# Patient Record
Sex: Male | Born: 1965 | Race: White | Hispanic: No | Marital: Single | Smoking: Never smoker
Health system: Southern US, Community
[De-identification: ages and names within clinical notes are randomized; demographics above are authoritative.]

## PROBLEM LIST (undated history)

## (undated) DIAGNOSIS — I10 Essential (primary) hypertension: Secondary | ICD-10-CM

## (undated) DIAGNOSIS — E785 Hyperlipidemia, unspecified: Secondary | ICD-10-CM

## (undated) HISTORY — DX: Essential (primary) hypertension: I10

## (undated) HISTORY — PX: WISDOM TOOTH EXTRACTION: SHX21

## (undated) HISTORY — DX: Hyperlipidemia, unspecified: E78.5

---

## 2009-03-25 ENCOUNTER — Ambulatory Visit: Payer: Self-pay | Admitting: Internal Medicine

## 2009-03-25 DIAGNOSIS — H53139 Sudden visual loss, unspecified eye: Secondary | ICD-10-CM | POA: Insufficient documentation

## 2009-03-25 DIAGNOSIS — J3089 Other allergic rhinitis: Secondary | ICD-10-CM

## 2009-03-25 DIAGNOSIS — J45998 Other asthma: Secondary | ICD-10-CM | POA: Insufficient documentation

## 2009-03-25 DIAGNOSIS — Z87898 Personal history of other specified conditions: Secondary | ICD-10-CM

## 2009-03-25 DIAGNOSIS — R21 Rash and other nonspecific skin eruption: Secondary | ICD-10-CM | POA: Insufficient documentation

## 2009-03-25 DIAGNOSIS — J302 Other seasonal allergic rhinitis: Secondary | ICD-10-CM | POA: Insufficient documentation

## 2009-04-01 LAB — CONVERTED CEMR LAB
ALT: 69 units/L — ABNORMAL HIGH (ref 0–53)
AST: 64 units/L — ABNORMAL HIGH (ref 0–37)
Alkaline Phosphatase: 44 units/L (ref 39–117)
BUN: 13 mg/dL (ref 6–23)
Basophils Absolute: 0 10*3/uL (ref 0.0–0.1)
Bilirubin, Direct: 0.1 mg/dL (ref 0.0–0.3)
Calcium: 9.2 mg/dL (ref 8.4–10.5)
Eosinophils Relative: 2.4 % (ref 0.0–5.0)
GFR calc non Af Amer: 76.87 mL/min (ref >60)
Glucose, Bld: 110 mg/dL — ABNORMAL HIGH (ref 70–99)
HCT: 49.4 % (ref 39.0–52.0)
HDL: 45.4 mg/dL (ref >39.00)
Lymphocytes Relative: 35.4 % (ref 12.0–46.0)
Lymphs Abs: 1.6 10*3/uL (ref 0.7–4.0)
Monocytes Relative: 11.7 % (ref 3.0–12.0)
Neutrophils Relative %: 50.4 % (ref 43.0–77.0)
Platelets: 187 10*3/uL (ref 150.0–400.0)
Potassium: 3.8 meq/L (ref 3.5–5.1)
RDW: 12.2 % (ref 11.5–14.6)
TSH: 1.85 microintl units/mL (ref 0.35–5.50)
Total Bilirubin: 0.5 mg/dL (ref 0.3–1.2)
Triglycerides: 175 mg/dL — ABNORMAL HIGH (ref 0.0–149.0)
VLDL: 35 mg/dL (ref 0.0–40.0)
WBC: 4.6 10*3/uL (ref 4.5–10.5)

## 2009-12-16 ENCOUNTER — Ambulatory Visit: Payer: Self-pay | Admitting: Family Medicine

## 2010-01-10 ENCOUNTER — Telehealth (INDEPENDENT_AMBULATORY_CARE_PROVIDER_SITE_OTHER): Payer: Self-pay | Admitting: *Deleted

## 2010-01-21 ENCOUNTER — Ambulatory Visit: Admit: 2010-01-21 | Payer: Self-pay | Admitting: Internal Medicine

## 2010-02-11 NOTE — Assessment & Plan Note (Signed)
Summary: NEW PT PHYSICAL AND FASTING LABS////SPH   Vital Signs:  Patient profile:   45 year old male Height:      67.5 inches Weight:      217.8 pounds BMI:     33.73 Temp:     98.2 degrees F oral Pulse rate:   91 / minute Resp:     14 per minute BP sitting:   118 / 80  (left arm) Cuff size:   large  Vitals Entered By: Shonna Chock (March 25, 2009 8:19 AM) CC: New Patient Establish: CPX with fasting labs  Comments REVIEWED MED LIST, PATIENT AGREED DOSE AND INSTRUCTION CORRECT  Refused TDaP Update today   CC:  New Patient Establish: CPX with fasting labs .  History of Present Illness: Corey Soto is here for a physical; he has some some vision issues, specifically 2 episodes of  transient diplopia following  rapid head rotation, lasting 7(actually timed)- ? 15 minutes. OS vision "went dark" for  ? period ,? X2.These episodes have occurred  in past 18 months.  Preventive Screening-Counseling & Management  Alcohol-Tobacco     Smoking Status: never  Caffeine-Diet-Exercise     Does Patient Exercise: yes  Allergies (verified): No Known Drug Allergies  Past History:  Past Medical History: Allergic rhinitis Asthma  Past Surgical History: Wisdom Teeth Extraction  Family History: Father: hyperglycemia Mother: negative Siblings: neg; MGM lung CA;MGF ophth issues  Social History: Occupation:Computer Work Single Never Smoked Alcohol use-no Regular exercise-yes: 1X /week as weights Smoking Status:  never Does Patient Exercise:  yes  Review of Systems General:  Denies chills, fatigue, fever, sleep disorder, sweats, and weight loss. Eyes:  See HPI; Complains of double vision and vision loss-1 eye; denies discharge, eye pain, and red eye. ENT:  Complains of nasal congestion, postnasal drainage, ringing in ears, and sinus pressure; denies difficulty swallowing and hoarseness; Perennial symptoms even when on allergy shots. Tinnitus since childhood. CV:  Complains of shortness  of breath with exertion; denies chest pain or discomfort, difficulty breathing at night, difficulty breathing while lying down, leg cramps with exertion, palpitations, swelling of feet, and swelling of hands; Occa DOE on elliptical. Resp:  Denies cough, excessive snoring, hypersomnolence, morning headaches, sputum productive, and wheezing; Off Advair X 6 months; ? some decreased capacity. Rare use of rescue albuteral MDI. GI:  Denies abdominal pain, bloody stools, constipation, dark tarry stools, diarrhea, and indigestion. GU:  Denies discharge, dysuria, and hematuria. MS:  Denies joint redness, joint swelling, low back pain, mid back pain, muscle aches, muscle weakness, and thoracic pain. Derm:  Complains of dryness and rash; denies changes in nail beds, hair loss, and lesion(s); Facial rash for years, no Rx. Neuro:  Complains of visual disturbances; denies brief paralysis, disturbances in coordination, headaches, inability to speak, numbness, poor balance, sensation of room spinning, tingling, and weakness; No BPV. Psych:  Denies anxiety and depression. Endo:  Denies cold intolerance, excessive hunger, excessive thirst, excessive urination, and heat intolerance. Heme:  Denies abnormal bruising and bleeding. Allergy:  Complains of sneezing; denies hives or rash and itching eyes; Rare sneezing.  Physical Exam  General:  well-nourished; alert,appropriate and cooperative throughout examination Head:  Normocephalic and atraumatic without obvious abnormalities. No apparent alopecia ; moustache & goatee Eyes:  No corneal or conjunctival inflammation noted. EOMI. Perrla. Funduscopic exam benign, without hemorrhages, exudates or papilledema.Field of  Vision grossly normal. Ears:  External ear exam shows no significant lesions or deformities.  Otoscopic examination reveals clear canals, tympanic  membranes are intact bilaterally without bulging, retraction, inflammation or discharge. Hearing is grossly  normal bilaterally. Nose:  External nasal examination shows no deformity or inflammation. Nasal mucosa are dry without lesions or exudates.Polypoid changes L nare Mouth:  Oral mucosa and oropharynx without lesions or exudates.  Teeth in good repair. Neck:  No deformities, masses, or tenderness noted. Lungs:  Normal respiratory effort, chest expands symmetrically. Lungs are clear to auscultation, no crackles or wheezes. Heart:  Normal rate and regular rhythm. S1 and S2 normal without gallop, murmur, click, rub . S4 Abdomen:  Bowel sounds positive,abdomen soft and non-tender without masses, organomegaly or hernias noted. Rectal:  No external abnormalities noted. Normal sphincter tone. No rectal masses or tenderness. Genitalia:  Testes bilaterally descended without nodularity, tenderness or masses. No scrotal masses or lesions. No penis lesions or urethral discharge. Prostate:  Prostate gland firm and smooth, no enlargement, nodularity, tenderness, mass, asymmetry or induration. Msk:  No deformity or scoliosis noted of thoracic or lumbar spine.   Pulses:  R and L carotid,radial,dorsalis pedis and posterior tibial pulses are full and equal bilaterally Extremities:  No clubbing, cyanosis, edema, or deformity noted with normal full range of motion of all joints.  Mild crepitus of knees  Neurologic:  alert & oriented X3, cranial nerves II-XII intact, strength normal in all extremities, sensation intact to light touch, gait normal, DTRs symmetrical and normal, finger-to-nose normal, heel-to-shin normal, and Romberg negative.   No pronator drift. RAM & repetitive speech normal. Skin:  Facial rosacea suggested Cervical Nodes:  No lymphadenopathy noted Axillary Nodes:  No palpable lymphadenopathy Inguinal Nodes:  No significant adenopathy Psych:  memory intact for recent and remote, normally interactive, and good eye contact.     Impression & Recommendations:  Problem # 1:  ROUTINE GENERAL MEDICAL  [email or web address]  CARE FACL (ICD-V70.0)  Orders: EKG w/ Interpretation (93000) Venipuncture (32355) TLB-Lipid Panel (80061-LIPID) TLB-BMP (Basic Metabolic Panel-BMET) (80048-METABOL) TLB-CBC Platelet - w/Differential (85025-CBCD) TLB-Hepatic/Liver Function Pnl (80076-HEPATIC) TLB-TSH (Thyroid Stimulating Hormone) (84443-TSH)  Problem # 2:  DIPLOPIA, HX OF (ICD-V13.8) W/O weakness or dysphagia Orders: Venipuncture (73220)  Problem # 3:  VISION LOSS, SUDDEN (ICD-368.11)  OS X 2  Orders: Venipuncture (25427)  Problem # 4:  ASTHMA (ICD-493.90) Mild , intermittent His updated medication list for this problem includes:    Proventil Hfa 108 (90 Base) Mcg/act Aers (Albuterol sulfate) .Marland Kitchen... 1-2 x weekly    Advair Diskus 100-50 Mcg/dose Aepb (Fluticasone-salmeterol) .Marland Kitchen... 1 inhalation every 12 hrs; gargle & spit after use  Problem # 5:  RASH-NONVESICULAR (ICD-782.1) Rosacea suggested clinically  Complete Medication List: 1)  Proventil Hfa 108 (90 Base) Mcg/act Aers (Albuterol sulfate) .Marland Kitchen.. 1-2 x weekly 2)  Metrogel 1 % Gel (Metronidazole) .... Apply once daily after cleansing face 3)  Advair Diskus 100-50 Mcg/dose Aepb (Fluticasone-salmeterol) .Marland Kitchen.. 1 inhalation every 12 hrs; gargle & spit after use  Patient Instructions: 1)  Keep  diary of  events affecting vision. Make appt with with an OPHTHALMOLOGIST to assess these episodes.  Prescriptions: ADVAIR DISKUS 100-50 MCG/DOSE AEPB (FLUTICASONE-SALMETEROL) 1 inhalation every 12 hrs; gargle & spit after use  #3 x 3   Entered and Authorized by:   Marga Melnick MD   Signed by:   Marga Melnick MD on 03/25/2009   Method used:   Print then Give to Patient   RxID:   [id number] METROGEL 1 % GEL (METRONIDAZOLE) apply once daily after cleansing face  #30 grams x 1   Entered and Authorized by:  Marga Melnick MD   Signed by:   Marga Melnick MD on 03/25/2009   Method used:   Print then Give to Patient   RxID:   [id number] PROVENTIL  HFA 108 (90 BASE) MCG/ACT AERS (ALBUTEROL SULFATE) 1-2 x weekly  #1 x 2   Entered and Authorized by:   Marga Melnick MD   Signed by:   Marga Melnick MD on 03/25/2009   Method used:   Print then Give to Patient   RxID:   [id number]

## 2010-02-11 NOTE — Assessment & Plan Note (Signed)
Summary: COUGH,LOSS OF VOICE/RH......Marland Kitchen   Vital Signs:  Patient profile:   45 year old male Weight:      213 pounds BMI:     32.99 O2 Sat:      95 % on Room air Temp:     99.4 degrees F oral Pulse rate:   106 / minute BP sitting:   126 / 80  (left arm)  Vitals Entered By: Doristine Devoid CMA (December 16, 2009 11:43 AM)  O2 Flow:  Room air CC: cough and sinus pressure    History of Present Illness: 45 yo man here today for ? sinus infxn.  sxs started 8 days ago w/ nasal congestion.  cough developed- 'croupy' sounding.  brother has similar sxs (was traveling w/ pt at the time).  subjective temps at home.  + facial pain.  no tooth pain.  cough rarely productive.  taking AlkaSeltzer and Robitussin DM.  no ear pain.  Current Medications (verified): 1)  Proventil Hfa 108 (90 Base) Mcg/act Aers (Albuterol Sulfate) .Marland Kitchen.. 1-2 X Weekly 2)  Metrogel 1 % Gel (Metronidazole) .... Apply Once Daily After Cleansing Face 3)  Advair Diskus 100-50 Mcg/dose Aepb (Fluticasone-Salmeterol) .Marland Kitchen.. 1 Inhalation Every 12 Hrs; Gargle & Spit After Use  Allergies (verified): No Known Drug Allergies  Review of Systems      See HPI  Physical Exam  General:  well-nourished; alert,appropriate and cooperative throughout examination Head:  Normocephalic and atraumatic without obvious abnormalities. no TTP over sinuses Eyes:  no injxn or inflammation Ears:  External ear exam shows no significant lesions or deformities.  Otoscopic examination reveals clear canals, tympanic membranes are intact bilaterally without bulging, retraction, inflammation or discharge. Hearing is grossly normal bilaterally. Nose:  + congestion, turbinate edema Mouth:  Oral mucosa and oropharynx without lesions or exudates.  Teeth in good repair. Neck:  No deformities, masses, or tenderness noted. Lungs:  Normal respiratory effort, chest expands symmetrically. Lungs are clear to auscultation, no crackles or wheezes. Heart:  Normal rate and  regular rhythm. S1 and S2 normal without gallop, murmur, click, rub .   Impression & Recommendations:  Problem # 1:  BRONCHITIS- ACUTE (ICD-466.0) Assessment New start Zpack as sxs worsening over last 8 days.  reviewed supportive care and red flags that should prompt return.  Pt expresses understanding and is in agreement w/ this plan. His updated medication list for this problem includes:    Proventil Hfa 108 (90 Base) Mcg/act Aers (Albuterol sulfate) .Marland Kitchen... 1-2 x weekly    Advair Diskus 100-50 Mcg/dose Aepb (Fluticasone-salmeterol) .Marland Kitchen... 1 inhalation every 12 hrs; gargle & spit after use    Azithromycin 250 Mg Tabs (Azithromycin) .Marland Kitchen... 2 by  mouth today and then 1 daily for 4 days    Tessalon 200 Mg Caps (Benzonatate) .Marland Kitchen... Take one capsule by mouth three times a day as needed for cough    Cheratussin Ac 100-10 Mg/71ml Syrp (Guaifenesin-codeine) .Marland Kitchen... 1-2 tsps q4-6 as needed for cough.  will cause drowsiness    Allegra-d Allergy & Congestion 180-240 Mg Xr24h-tab (Fexofenadine-pseudoephedrine) .Marland Kitchen... 1 tab by mouth daily  Problem # 2:  ASTHMA (ICD-493.90) Assessment: Unchanged  pt would like referral to new allergist b/c he no longer works in [place]. His updated medication list for this problem includes:    Proventil Hfa 108 (90 Base) Mcg/act Aers (Albuterol sulfate) .Marland Kitchen... 1-2 x weekly    Advair Diskus 100-50 Mcg/dose Aepb (Fluticasone-salmeterol) .Marland Kitchen... 1 inhalation every 12 hrs; gargle & spit after use  Orders:  Allergy Referral  (Allergy)  Complete Medication List: 1)  Proventil Hfa 108 (90 Base) Mcg/act Aers (Albuterol sulfate) .Marland Kitchen.. 1-2 x weekly 2)  Metrogel 1 % Gel (Metronidazole) .... Apply once daily after cleansing face 3)  Advair Diskus 100-50 Mcg/dose Aepb (Fluticasone-salmeterol) .Marland Kitchen.. 1 inhalation every 12 hrs; gargle & spit after use 4)  Azithromycin 250 Mg Tabs (Azithromycin) .... 2 by  mouth today and then 1 daily for 4 days 5)  Tessalon 200 Mg Caps (Benzonatate) ....  Take one capsule by mouth three times a day as needed for cough 6)  Cheratussin Ac 100-10 Mg/36ml Syrp (Guaifenesin-codeine) .Marland Kitchen.. 1-2 tsps q4-6 as needed for cough.  will cause drowsiness 7)  Allegra-d Allergy & Congestion 180-240 Mg Xr24h-tab (Fexofenadine-pseudoephedrine) .Marland Kitchen.. 1 tab by mouth daily  Patient Instructions: 1)  This appears to be a bronchitis- take the Azithromycin as directed 2)  Drink plenty of fluids 3)  Tylenol or ibuprofen for pain or fever 4)  Add Mucinex (Guafenesin) to loosen cough and facilitate drainage 5)  Use the tessalon for day cough and the codeine syrup for night and weekend cough 6)  REST! 7)  Hang in there!!! Prescriptions: ALLEGRA-D ALLERGY & CONGESTION 180-240 MG XR24H-TAB (FEXOFENADINE-PSEUDOEPHEDRINE) 1 tab by mouth daily  #90 x 0   Entered and Authorized by:   Neena Rhymes MD   Signed by:   Neena Rhymes MD on 12/16/2009   Method used:   Print then Give to Patient   RxID:   [id number] ADVAIR DISKUS 100-50 MCG/DOSE AEPB (FLUTICASONE-SALMETEROL) 1 inhalation every 12 hrs; gargle & spit after use  #3 x 0   Entered and Authorized by:   Neena Rhymes MD   Signed by:   Neena Rhymes MD on 12/16/2009   Method used:   Print then Give to Patient   RxID:   [id number] PROVENTIL HFA 108 (90 BASE) MCG/ACT AERS (ALBUTEROL SULFATE) 1-2 x weekly  #3 months x 0   Entered and Authorized by:   Neena Rhymes MD   Signed by:   Neena Rhymes MD on 12/16/2009   Method used:   Print then Give to Patient   RxID:   [id number] CHERATUSSIN AC 100-10 MG/5ML SYRP (GUAIFENESIN-CODEINE) 1-2 tsps Q4-6 as needed for cough.  will cause drowsiness  #135ml x 0   Entered and Authorized by:   Neena Rhymes MD   Signed by:   Neena Rhymes MD on 12/16/2009   Method used:   Print then Give to Patient   RxID:   [id number] TESSALON 200 MG CAPS (BENZONATATE) Take one capsule by mouth three times a day as needed for cough  #60 x 0   Entered  and Authorized by:   Neena Rhymes MD   Signed by:   Neena Rhymes MD on 12/16/2009   Method used:   Electronically to        [facility]  [address] [id number] ([facility])       [address]       [place], [place]  [address]       Ph: [phone] or [phone]       Fax: [phone]   RxID:   [id number] AZITHROMYCIN 250 MG  TABS (AZITHROMYCIN) 2 by  mouth today and then 1 daily for 4 days  #6 x 0   Entered and Authorized by:   Neena Rhymes MD   Signed by:   Neena Rhymes MD on 12/16/2009   Method used:  [facility]ically to        [facility]  [address] [id number] ([facility]       [address]       [place], [place]  [address]       Ph: [phone] or [phone]       Fax: [phone]   RxID:   [id number]    Orders Added: 1)  Allergy Referral  [Allergy] 2)  Est. Patient Level III [25427]

## 2010-02-13 NOTE — Progress Notes (Signed)
Summary: RX Clarification  Phone Note From Pharmacy   Caller: [facility] Summary of Call: Proventil HFA directions are not understood. What are the directions?  Fax back information to [phone] or call [phone] Initial call taken by: Harold Barban,  January 10, 2010 8:22 AM  Follow-up for Phone Call        Dr.Hopper please advise  Follow-up by: Shonna Chock CMA,  January 10, 2010 12:35 PM  Additional Follow-up for Phone Call Additional follow up Details #1::        1-2 puffs every 4 hrs as needed shortness of breath or cough Additional Follow-up by: Marga Melnick MD,  January 10, 2010 1:35 PM    Additional Follow-up for Phone Call Additional follow up Details #2::    I called rx in to the pharmacy with corrected instructions./[facility] CMA  January 10, 2010 2:33 PM   New/Updated Medications: PROVENTIL HFA 108 (90 BASE) MCG/ACT AERS (ALBUTEROL SULFATE) 1-2 puffs every 4 hrs as needed shortness of breath or cough Prescriptions: PROVENTIL HFA 108 (90 BASE) MCG/ACT AERS (ALBUTEROL SULFATE) 1-2 puffs every 4 hrs as needed shortness of breath or cough  #73mo x 0   Entered by:   Shonna Chock CMA   Authorized by:   Marga Melnick MD   Signed by:   Shonna Chock CMA on 01/10/2010   Method used:   Historical   RxID:   [id number]

## 2010-03-07 ENCOUNTER — Institutional Professional Consult (permissible substitution) (INDEPENDENT_AMBULATORY_CARE_PROVIDER_SITE_OTHER): Payer: BC Managed Care – PPO | Admitting: Internal Medicine

## 2010-03-07 ENCOUNTER — Encounter: Payer: Self-pay | Admitting: Internal Medicine

## 2010-03-07 DIAGNOSIS — J309 Allergic rhinitis, unspecified: Secondary | ICD-10-CM

## 2010-03-07 DIAGNOSIS — J45909 Unspecified asthma, uncomplicated: Secondary | ICD-10-CM

## 2010-03-09 DIAGNOSIS — E782 Mixed hyperlipidemia: Secondary | ICD-10-CM | POA: Insufficient documentation

## 2010-03-20 NOTE — Assessment & Plan Note (Signed)
Summary: asthma/allergic rhinitis  appt. made with renee called 1.9/le...   Vital Signs:  Patient profile:   46 year old male Height:      66.5 inches Weight:      209.50 pounds BMI:     33.43 O2 Sat:      94 % on Room air Pulse rate:   104 / minute BP sitting:   120 / 80  (left arm) Cuff size:   regular  Vitals Entered By: Reynaldo Minium CMA (March 07, 2010 4:01 PM)  O2 Flow:  Room air   Primary Provider/Referring Provider:  Marga Melnick MD   History of Present Illness: March 07, 2010- 45yoM followed by Dr Alwyn Ren and here now asking allergy evaluation. He has hx of asthma since childhood, going to ER once at age 89, but no overnight stay. He doesn't remember needing prednisone. He was followed at [facility] in [place], where he was skin tested about 6 years ago and on allergy vaccine several years. [place] now lives in [place]- wanted to establish closer care. No severe attack in yearsa, but doesn't "feel he breathes as well" as he should. Denies atopic triggers since allergy shots. Main triggers now are exercise- which may make him more short of breath than expected, but rarely wheezey, and virus infections. Breathing does not wake him. Denies reflux/ heartburn. Intervals of nasal congestion and postnasal drip but little sneeze or itch. No hx intolerance ot ASA; no ENT surgery or polyps. Has Advair 100 and Proventil- both used occasionally as needed. Occasional Allegra-D, Tessalon and Cheratussin.  Asthma History    Initial Asthma Severity Rating:    Age range: 12+ years    Symptoms: 0-2 days/week    Nighttime Awakenings: 0-2/month    Interferes w/ normal activity: no limitations    SABA use (not for EIB): 0-2 days/week    Asthma Severity Assessment: Intermittent   Preventive Screening-Counseling & Management  Alcohol-Tobacco     Smoking Status: never  Current Medications (verified): 1)  Proventil Hfa 108 (90 Base) Mcg/act Aers  (Albuterol Sulfate) .Marland Kitchen.. 1-2 Puffs Every 4 Hrs As Needed Shortness of Breath or Cough 2)  Advair Diskus 100-50 Mcg/dose Aepb (Fluticasone-Salmeterol) .Marland Kitchen.. 1 Inhalation Every 12 Hrs; Gargle & Spit After Use 3)  Allegra-D Allergy & Congestion 180-240 Mg Xr24h-Tab (Fexofenadine-Pseudoephedrine) .Marland Kitchen.. 1 Tab By Mouth Daily 4)  Multivitamins  Tabs (Multiple Vitamin) .... 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   Review of Systems      See HPI       The patient complains of shortness of breath with activity and tooth/dental problems.  The patient denies shortness of breath at rest, productive cough, non-productive cough, coughing up blood, chest pain, irregular heartbeats, acid heartburn, indigestion, loss of appetite, weight change, abdominal pain, difficulty swallowing,  sore throat, headaches, nasal congestion/difficulty breathing through nose, sneezing, itching, ear ache, anxiety, depression, hand/feet swelling, joint stiffness or pain, rash, change in color of mucus, and fever.    Physical Exam  Additional Exam:  General: A/Ox3; pleasant and cooperative,  NAD, SKIN: no rash, lesions NODES: no lymphadenopathy HEENT: [place]/AT, EOM- WNL, Conjuctivae- clear, PERRLA, TM-WNL, Nose- clear, minor crusting, no polyps, Throat- clear and wnl,  Mallampati  III-IV NECK: Supple w/ fair ROM, JVD- none, normal carotid impulses w/o bruits Thyroid- normal to palpation CHEST: Clear to P&A HEART: RRR, no m/g/r heard ABDOMEN: Soft and nl; nml bowel sounds; no organomegaly or masses noted ZOX:WRUE, nl pulses, no edema  NEURO: Grossly intact to observation      Impression & Recommendations:  Problem # 1:  ASTHMA (ICD-493.90) He has felt well recently, and describes mild intermittent asthma. His main concern is some question about whether he is more short of breath with exertion than he should be for his level of fitness. there doesn't seem tpo be a pressing issue, limitation or progression. Marland Kitchen He does recognize occasional chest tightness and wheeze. We discussed dyspsnea as a multifactorial symptom. We educated about use of his maintenance and rescue inhalers, and we will get PFT. i don't suspect cardiac, anemia or muscle weakness issues now.   Problem # 2:  ALLERGIC RHINITIS (ICD-477.9) He has been atopic enough inthe past to warrant allergy vaccine hyposensitization, and he uses Allegra-D. We will watch this as an issue with coming pollen season.. Discussed continued care to protect himself from wood-working dust and lacquers.   Medications Added to Medication List This Visit: 1)  Multivitamins Tabs (Multiple vitamin) .... Take 1 by mouth once daily  Other Orders: Consultation Level III (45409)  Patient Instructions: 1)  Please schedule a follow-up appointment in 2 months. 2)  Schedule PFT 3)  Consider using the Advair 1 puff and rinse mouth once every day.  If you don't feel you really need it,then see what happens if you stop it completely for now. 4)   You can continue to use the Proventil rescue inhaler 5)        2 puffs up to 4 times daily if  needed   Orders Added: 1)  Consultation Level III [81191]

## 2010-05-09 ENCOUNTER — Ambulatory Visit: Payer: BC Managed Care – PPO | Admitting: Internal Medicine

## 2010-06-27 ENCOUNTER — Ambulatory Visit (INDEPENDENT_AMBULATORY_CARE_PROVIDER_SITE_OTHER): Payer: BC Managed Care – PPO | Admitting: Internal Medicine

## 2010-06-27 ENCOUNTER — Encounter: Payer: Self-pay | Admitting: Internal Medicine

## 2010-06-27 VITALS — BP 122/70 | HR 91 | Ht 66.0 in | Wt 203.0 lb

## 2010-06-27 DIAGNOSIS — J309 Allergic rhinitis, unspecified: Secondary | ICD-10-CM

## 2010-06-27 DIAGNOSIS — J45909 Unspecified asthma, uncomplicated: Secondary | ICD-10-CM

## 2010-06-27 LAB — PULMONARY FUNCTION TEST

## 2010-06-27 NOTE — Progress Notes (Signed)
  Subjective:    Patient ID: Corey Soto, male    DOB: 1965/03/15, 45 y.o.   MRN: [id number]  HPI 06/27/10- Asthma, allergic rhintis Last here 02/15/10- Noted more nasal congestion in the Spring, especially while supine but better now.  Denies any significant cough wheeze or chest tightness Not using Advair regulary.  Aware of asthma. Previous allergy testing positive and was on shots for years. Thinks he does react some to his pets.  PFT- WNL FEV1/FVC 0.77  Review of Systems Constitutional:   No weight loss, night sweats,  Fevers, chills, fatigue, lassitude. HEENT:   No headaches,  Difficulty swallowing,  Tooth/dental problems,  Sore throat,                No sneezing, itching, ear ache, nasal congestion, post nasal drip,   CV:  No chest pain,  Orthopnea, PND, swelling in lower extremities, anasarca, dizziness, palpitations  GI  No heartburn, indigestion, abdominal pain, nausea, vomiting, diarrhea, change in bowel habits, loss of appetite  Resp: No shortness of breath with exertion or at rest.  No excess mucus, no productive cough,  No non-productive cough,  No coughing up of blood.  No change in color of mucus.  No wheezing.    Skin: no rash or lesions.  GU: no dysuria, change in color of urine, no urgency or frequency.  No flank pain.  MS:  No joint pain or swelling.  No decreased range of motion.  No back pain.  Psych:  No change in mood or affect. No depression or anxiety.  No memory loss.      Objective:   Physical Exam General- Alert, Oriented, Affect-appropriate, Distress- none acute  Skin- rash-none, lesions- none, excoriation- none  Lymphadenopathy- none  Head- atraumatic  Eyes- Gross vision intact, PERRLA, conjunctivae clear secretions  Ears- Hearing, canals, Tm- normal Nose- Clear, No- Septal dev, mucus, polyps, erosion, perforation   Throat- Mallampati II , mucosa clear , drainage- none, tonsils- atrophic  Neck- flexible , trachea midline, no stridor ,  thyroid nl, carotid no bruit  Chest - symmetrical excursion , unlabored     Heart/CV- RRR , no murmur , no gallop  , no rub, nl s1 s2                     - JVD- none , edema- none, stasis changes- none, varices- none     Lung- clear to P&A, wheeze- none, cough- none , dullness-none, rub- none     Chest wall- Abd- tender-no, distended-no, bowel sounds-present, HSM- no  Br/ Gen/ Rectal- Not done, not indicated  Extrem- cyanosis- none, clubbing, none, atrophy- none, strength- nl  Neuro- grossly intact to observation         Assessment & Plan:

## 2010-06-27 NOTE — Progress Notes (Signed)
PFT done today. 

## 2010-06-27 NOTE — Assessment & Plan Note (Addendum)
Mold and intermiitent with normal PFT now. We are going to let him stay off Advair for now, and have explained use of his rescue inhaler.

## 2010-06-27 NOTE — Patient Instructions (Signed)
Ok to stay off Advair for now if you don't need it.  Use the rescue inhaler up to 2 puffs/ 4 times daily as needed for chest tightness or wheeze  Set up return for allergy skin testing -   Important to be off antihistamines for 3 days ahead of testing. This includes allergy, cough and cold meds, sleep meds

## 2010-06-27 NOTE — Assessment & Plan Note (Signed)
Since he previously tested positive to animal danders and lives with 2 dogs he suspects may bother him, he asks to return for allergy testing.

## 2010-07-02 ENCOUNTER — Encounter: Payer: Self-pay | Admitting: Internal Medicine

## 2010-09-17 ENCOUNTER — Ambulatory Visit: Payer: BC Managed Care – PPO | Admitting: Internal Medicine

## 2011-02-18 ENCOUNTER — Ambulatory Visit (INDEPENDENT_AMBULATORY_CARE_PROVIDER_SITE_OTHER): Payer: BC Managed Care – PPO | Admitting: Internal Medicine

## 2011-02-18 ENCOUNTER — Encounter: Payer: Self-pay | Admitting: Internal Medicine

## 2011-02-18 VITALS — BP 122/80 | HR 79 | Ht 66.0 in | Wt 213.8 lb

## 2011-02-18 DIAGNOSIS — J45909 Unspecified asthma, uncomplicated: Secondary | ICD-10-CM

## 2011-02-18 DIAGNOSIS — J309 Allergic rhinitis, unspecified: Secondary | ICD-10-CM

## 2011-02-18 MED ORDER — FLUTICASONE-SALMETEROL 100-50 MCG/DOSE IN AEPB: 1.0000 | INHALATION_SPRAY | Freq: Two times a day (BID) | RESPIRATORY_TRACT | Status: DC

## 2011-02-18 NOTE — Patient Instructions (Signed)
Allergy skin testing was strongest for grass and some weed pollens, dust and some molds.  Ok to continue present meds.  Script sent for Advair refill  We will look again at your status in the grass pollen season at next visit.

## 2011-02-18 NOTE — Progress Notes (Signed)
Patient ID: Corey Soto, male    DOB: 1965/05/11, 46 y.o.   MRN: [id number]  HPI 06/27/10- Asthma, allergic rhintis Last here 02/15/10- Noted more nasal congestion in the Spring, especially while supine but better now.  Denies any significant cough wheeze or chest tightness Not using Advair regulary.  Aware of asthma. Previous allergy testing positive and was on shots for years. Thinks he does react some to his pets.  PFT- WNL FEV1/FVC 0.77  02/18/11- 46yo M never smoker, followed for allergic rhinitis, asthma Returns off antihistamines for allergy skin tests.  Since last here he has noted occasional chest tightness with little wheezing. Occasional use of his rescue inhaler does help. Perennial nasal congestion is worse at night and he blows out a lot in the morning. Has been taking Allegra-D. Has not felt badly in the last few days off of antihistamines for skin testing.  Review of Systems Constitutional:   No weight loss, night sweats,  Fevers, chills, fatigue, lassitude. HEENT:   No headaches,  Difficulty swallowing,  Tooth/dental problems,  Sore throat,                No sneezing, itching, ear ache, nasal congestion, post nasal drip,   CV:  No chest pain,  Orthopnea, PND, swelling in lower extremities, anasarca, dizziness, palpitations  GI  No heartburn, indigestion, abdominal pain, nausea, vomiting, diarrhea, change in bowel habits, loss of appetite  Resp: No shortness of breath with exertion or at rest.  No excess mucus, no productive cough,  No non-productive cough,  No coughing up of blood.  No change in color of mucus.  No wheezing.    Skin: no rash or lesions.  GU: no dysuria, change in color of urine, no urgency or frequency.  No flank pain.  MS:  No joint pain or swelling.  No decreased range of motion.  No back pain.  Psych:  No change in mood or affect. No depression or anxiety.  No memory loss.      Objective:  OBJ- Physical Exam General- Alert, Oriented,  Affect-appropriate, Distress- none acute, overweight Skin- rash-none, lesions- none, excoriation- none Lymphadenopathy- none Head- atraumatic            Eyes- Gross vision intact, PERRLA, conjunctivae and secretions clear            Ears- Hearing, canals-normal            Nose- Clear, no-Septal dev, mucus, polyps, erosion, perforation             Throat- Mallampati II , mucosa clear , drainage- none, tonsils- atrophic Neck- flexible , trachea midline, no stridor , thyroid nl, carotid no bruit Chest - symmetrical excursion , unlabored           Heart/CV- RRR , no murmur , no gallop  , no rub, nl s1 s2                           - JVD- none , edema- none, stasis changes- none, varices- none           Lung- clear to P&A, wheeze- none, cough- none , dullness-none, rub- none           Chest wall-  Abd- tender-no, distended-no, bowel sounds-present, HSM- no Br/ Gen/ Rectal- Not done, not indicated Extrem- cyanosis- none, clubbing, none, atrophy- none, strength- nl Neuro- grossly intact to observation

## 2011-02-21 NOTE — Assessment & Plan Note (Signed)
Currently well controlled.  

## 2011-02-27 ENCOUNTER — Encounter: Payer: Self-pay | Admitting: Internal Medicine

## 2011-04-20 ENCOUNTER — Ambulatory Visit: Payer: BC Managed Care – PPO | Admitting: Internal Medicine

## 2011-07-23 ENCOUNTER — Encounter: Payer: Self-pay | Admitting: Internal Medicine

## 2011-07-23 ENCOUNTER — Ambulatory Visit (INDEPENDENT_AMBULATORY_CARE_PROVIDER_SITE_OTHER): Payer: BC Managed Care – PPO | Admitting: Internal Medicine

## 2011-07-23 VITALS — BP 120/62 | HR 76 | Ht 66.0 in | Wt 218.0 lb

## 2011-07-23 DIAGNOSIS — J45909 Unspecified asthma, uncomplicated: Secondary | ICD-10-CM

## 2011-07-23 DIAGNOSIS — J45998 Other asthma: Secondary | ICD-10-CM

## 2011-07-23 DIAGNOSIS — J309 Allergic rhinitis, unspecified: Secondary | ICD-10-CM

## 2011-07-23 DIAGNOSIS — J3089 Other allergic rhinitis: Secondary | ICD-10-CM

## 2011-07-23 MED ORDER — FLUTICASONE-SALMETEROL 100-50 MCG/DOSE IN AEPB: 1.0000 | INHALATION_SPRAY | Freq: Two times a day (BID) | RESPIRATORY_TRACT | Status: DC

## 2011-07-23 MED ORDER — ALBUTEROL SULFATE HFA 108 (90 BASE) MCG/ACT IN AERS: 2.0000 | INHALATION_SPRAY | Freq: Four times a day (QID) | RESPIRATORY_TRACT | Status: DC | PRN

## 2011-07-23 NOTE — Progress Notes (Signed)
Patient ID: Corey Soto, male    DOB: 03-15-1965, 46 y.o.   MRN: [id number]  HPI 06/27/10- Asthma, allergic rhintis Last here 02/15/10- Noted more nasal congestion in the Spring, especially while supine but better now.  Denies any significant cough wheeze or chest tightness Not using Advair regulary.  Aware of asthma. Previous allergy testing positive and was on shots for years. Thinks he does react some to his pets.  PFT- WNL FEV1/FVC 0.77  02/18/11- 46yo M never smoker, followed for allergic rhinitis, asthma Returns off antihistamines for allergy skin tests.  Since last here he has noted occasional chest tightness with little wheezing. Occasional use of his rescue inhaler does help. Perennial nasal congestion is worse at night and he blows out a lot in the morning. Has been taking Allegra-D. Has not felt badly in the last few days off of antihistamines for skin testing. Allergy skin testing: Strongly positive for grass pollens and significant reactions for weed pollens, tree pollens, dust mite, feathers  07/23/11- 46yo M never smoker, followed for allergic rhinitis, asthma Pt states overall he sees improvement in allergies since last OV. Pt states he has not taken the Allegra-D 24 in the last 3-45months and has been out of the Advair x 2 months.  We discussed his skin test reactions from last visit. He is quite comfortable with minimal treatment now. We are sure why this year is much better than previous.  ROS-see HPI Constitutional:   No-   weight loss, night sweats, fevers, chills, fatigue, lassitude. HEENT:   No-  headaches, difficulty swallowing, tooth/dental problems, sore throat,       No-  sneezing, itching, ear ache, nasal congestion, post nasal drip,  CV:  No-   chest pain, orthopnea, PND, swelling in lower extremities, anasarca, izziness, palpitations Resp: No-   shortness of breath with exertion or at rest.              No-   productive cough,  No non-productive cough,  No-  coughing up of blood.              No-   change in color of mucus.  No- wheezing.   Skin: No-   rash or lesions. GI:  No-   heartburn, indigestion, abdominal pain, nausea, vomiting,  GU: . MS:  No-   joint pain or swelling.  Neuro-     nothing unusual Psych:  No- change in mood or affect. No depression or anxiety.  No memory loss.   Objective:  OBJ- Physical Exam General- Alert, Oriented, Affect-appropriate, Distress- none acute, overweight but looks comfortable Skin- rash-none, lesions- none, excoriation- none Lymphadenopathy- none Head- atraumatic            Eyes- Gross vision intact, PERRLA, conjunctivae and secretions clear            Ears- Hearing, canals-normal            Nose- Clear, no-Septal dev, mucus, polyps, erosion, perforation             Throat- Mallampati II , mucosa clear , drainage- none, tonsils- atrophic Neck- flexible , trachea midline, no stridor , thyroid nl, carotid no bruit Chest - symmetrical excursion , unlabored           Heart/CV- RRR , no murmur , no gallop  , no rub, nl s1 s2                           -  JVD- none , edema- none, stasis changes- none, varices- none           Lung- clear to P&A, wheeze- none, cough- none , dullness-none, rub- none           Chest wall-  Abd-  Br/ Gen/ Rectal- Not done, not indicated Extrem- cyanosis- none, clubbing, none, atrophy- none, strength- nl Neuro- grossly intact to observation

## 2011-07-23 NOTE — Patient Instructions (Addendum)
Scripts for med refills  Please call as needed  

## 2011-07-29 NOTE — Assessment & Plan Note (Signed)
He has been fairly well controlled with rescue inhaler and antihistamine. We will be watching as the spring pollen season begins, for additional needs.

## 2011-07-29 NOTE — Assessment & Plan Note (Signed)
He is having an exceptionally good year. We aren't sure why we will keep his medications available even though he does not feel that he is using now.

## 2012-07-22 ENCOUNTER — Ambulatory Visit: Payer: BC Managed Care – PPO | Admitting: Internal Medicine

## 2012-08-02 ENCOUNTER — Ambulatory Visit (INDEPENDENT_AMBULATORY_CARE_PROVIDER_SITE_OTHER): Payer: BC Managed Care – PPO | Admitting: Internal Medicine

## 2012-08-02 ENCOUNTER — Encounter: Payer: Self-pay | Admitting: Internal Medicine

## 2012-08-02 VITALS — BP 122/76 | HR 86 | Ht 66.0 in | Wt 220.0 lb

## 2012-08-02 DIAGNOSIS — J45998 Other asthma: Secondary | ICD-10-CM

## 2012-08-02 DIAGNOSIS — J3089 Other allergic rhinitis: Secondary | ICD-10-CM

## 2012-08-02 DIAGNOSIS — J45909 Unspecified asthma, uncomplicated: Secondary | ICD-10-CM

## 2012-08-02 DIAGNOSIS — J309 Allergic rhinitis, unspecified: Secondary | ICD-10-CM

## 2012-08-02 MED ORDER — ALBUTEROL SULFATE HFA 108 (90 BASE) MCG/ACT IN AERS: INHALATION_SPRAY | RESPIRATORY_TRACT | Status: DC

## 2012-08-02 MED ORDER — MOMETASONE FURO-FORMOTEROL FUM 100-5 MCG/ACT IN AERO: INHALATION_SPRAY | RESPIRATORY_TRACT | Status: DC

## 2012-08-02 NOTE — Patient Instructions (Addendum)
We are changing your scripts to  Ventolin HFA-   2 puffs, up to 4 times daily when needed   Rescue inhaler  Dulera 100- 2 puffs and rinse, twice daily    Maintenance inhaler  Please call as needed

## 2012-08-02 NOTE — Progress Notes (Signed)
Patient ID: Corey Soto, male    DOB: July 15, 1965, 47 y.o.   MRN: [id number]  HPI 06/27/10- Asthma, allergic rhintis Last here 02/15/10- Noted more nasal congestion in the Spring, especially while supine but better now.  Denies any significant cough wheeze or chest tightness Not using Advair regulary.  Aware of asthma. Previous allergy testing positive and was on shots for years. Thinks he does react some to his pets.  PFT- WNL FEV1/FVC 0.77  02/18/11- 46yo M never smoker, followed for allergic rhinitis, asthma Returns off antihistamines for allergy skin tests.  Since last here he has noted occasional chest tightness with little wheezing. Occasional use of his rescue inhaler does help. Perennial nasal congestion is worse at night and he blows out a lot in the morning. Has been taking Allegra-D. Has not felt badly in the last few days off of antihistamines for skin testing. Allergy skin testing: Strongly positive for grass pollens and significant reactions for weed pollens, tree pollens, dust mite, feathers  07/23/11- 46yo M never smoker, followed for allergic rhinitis, asthma Pt states overall he sees improvement in allergies since last OV. Pt states he has not taken the Allegra-D 24 in the last 3-101months and has been out of the Advair x 2 months.  We discussed his skin test reactions from last visit. He is quite comfortable with minimal treatment now. We are not sure why this year is much better than previous.  08/02/12- 22yo M never smoker, followed for allergic rhinitis, asthma FOLLOWS YQM:VHQIO well now,had allergy problems in spring,has pnd keeps this, denies sob or cough,needs to discuss albuterol and advair not covered by insurance,needs something else Spring pollen season was worse this year but he feels well now. Last year was unusually good by comparison.  ROS-see HPI Constitutional:   No-   weight loss, night sweats, fevers, chills, fatigue, lassitude. HEENT:   No-  headaches,  difficulty swallowing, tooth/dental problems, sore throat,       No-  sneezing, itching, ear ache, +nasal congestion, post nasal drip,  CV:  No-   chest pain, orthopnea, PND, swelling in lower extremities, anasarca, izziness, palpitations Resp: No-   shortness of breath with exertion or at rest.              No-   productive cough,  No non-productive cough,  No- coughing up of blood.              No-   change in color of mucus.  No- wheezing.   Skin: No-   rash or lesions. GI:  No-   heartburn, indigestion, abdominal pain, nausea, vomiting,  GU: . MS:  No-   joint pain or swelling.  Neuro-     nothing unusual Psych:  No- change in mood or affect. No depression or anxiety.  No memory loss.   Objective:  OBJ- Physical Exam General- Alert, Oriented, Affect-appropriate, Distress- none acute, overweight but looks comfortable Skin- rash-none, lesions- none, excoriation- none Lymphadenopathy- none Head- atraumatic            Eyes- Gross vision intact, PERRLA, conjunctivae and secretions clear            Ears- Hearing, canals-normal            Nose- +pale mucosa, no-Septal dev, mucus, polyps, erosion, perforation             Throat- Mallampati III , mucosa clear , drainage- none, tonsils- atrophic Neck- flexible , trachea midline, no stridor ,  thyroid nl, carotid no bruit Chest - symmetrical excursion , unlabored           Heart/CV- RRR , no murmur , no gallop  , no rub, nl s1 s2                           - JVD- none , edema- none, stasis changes- none, varices- none           Lung- clear to P&A, wheeze- none, cough- none , dullness-none, rub- none           Chest wall-  Abd-  Br/ Gen/ Rectal- Not done, not indicated Extrem- cyanosis- none, clubbing, none, atrophy- none, strength- nl Neuro- grossly intact to observation

## 2012-08-11 ENCOUNTER — Telehealth: Payer: Self-pay | Admitting: Internal Medicine

## 2012-08-11 MED ORDER — ALBUTEROL SULFATE HFA 108 (90 BASE) MCG/ACT IN AERS: 2.0000 | INHALATION_SPRAY | Freq: Four times a day (QID) | RESPIRATORY_TRACT | Status: DC | PRN

## 2012-08-11 NOTE — Telephone Encounter (Signed)
Called [facility], spoke with Norva Riffle, Pharmacist.   Gave VO to change proventil hfa to ventolin hfa 2 puffs qid prn # 3 x 3. Kathlene November verbalized understanding and voiced no further questions or concerns at this time.

## 2012-08-11 NOTE — Telephone Encounter (Signed)
Per Cy--  Ok Albuterol HFA #3 inhaler  Take 2 puff qid prn. Ref x 3

## 2012-08-11 NOTE — Telephone Encounter (Signed)
CY---[facility] is needing an alternative medication since the proventil is not covered by the pts insurance.  Please advise of any alternatives.  Thanks  No Known Allergies   Last ov--08/02/2012 Next ov--08/02/2013

## 2012-08-17 NOTE — Assessment & Plan Note (Signed)
Refill albuterol HFA. Change Advair to Dulera 100. Medication talk.

## 2012-08-17 NOTE — Assessment & Plan Note (Signed)
Seasonal exacerbation has not resolved. Consider as a candidate for Grasstek next spring

## 2012-10-05 ENCOUNTER — Ambulatory Visit (INDEPENDENT_AMBULATORY_CARE_PROVIDER_SITE_OTHER): Payer: BC Managed Care – PPO | Admitting: Internal Medicine

## 2012-10-05 ENCOUNTER — Encounter: Payer: Self-pay | Admitting: Internal Medicine

## 2012-10-05 VITALS — BP 155/101 | HR 89 | Temp 98.5°F | Ht 66.75 in | Wt 223.8 lb

## 2012-10-05 DIAGNOSIS — R9431 Abnormal electrocardiogram [ECG] [EKG]: Secondary | ICD-10-CM

## 2012-10-05 DIAGNOSIS — E785 Hyperlipidemia, unspecified: Secondary | ICD-10-CM

## 2012-10-05 DIAGNOSIS — J302 Other seasonal allergic rhinitis: Secondary | ICD-10-CM

## 2012-10-05 DIAGNOSIS — J309 Allergic rhinitis, unspecified: Secondary | ICD-10-CM

## 2012-10-05 DIAGNOSIS — Z0000 Encounter for general adult medical examination without abnormal findings: Secondary | ICD-10-CM

## 2012-10-05 MED ORDER — FLUTICASONE PROPIONATE 50 MCG/ACT NA SUSP: 1.0000 | Freq: Two times a day (BID) | NASAL | Status: DC | PRN

## 2012-10-05 NOTE — Patient Instructions (Addendum)
Take the EKG to any emergency room or preop visits. There are nonspecific changes; as long as there is no new change these are not clinically significant . If the old EKG is not available for comparison; it may result in unnecessary hospitalization for observation with significant unnecessary expense. To prevent palpitations or premature beats, avoid stimulants such as decongestants, diet pills, nicotine, or caffeine (coffee, tea, cola, or chocolate) to excess. Plain Mucinex (NOT D) for thick secretions ;force NON dairy fluids .   Nasal cleansing in the shower as discussed with lather of mild shampoo.After 10 seconds wash off lather while  exhaling through nostrils. Make sure that all residual soap is removed to prevent irritation.  Fluticasone 1 spray in each nostril twice a day as needed. Use the "crossover" technique into opposite nostril spraying toward opposite ear @ 45 degree angle, not straight up into nostril.  Use a Neti pot daily only  as needed for significant sinus congestion; going from open side to congested side . Plain Allegra (NOT D )  160 daily , Loratidine 10 mg , OR Zyrtec 10 mg @ bedtime  as needed for itchy eyes & sneezing.     If you activate the  My Chart system; lab & Xray results will be released directly  to you as soon as I review & address these through the computer. If you choose not to sign up for My Chart within 36 hours of labs being drawn; results will be reviewed & interpretation added before being copied & mailed, causing a delay in getting the results to you.If you do not receive that report within 7-10 days ,please call. Additionally you can use this system to gain direct  access to your records  if  out of town or @ an office of a  physician who is not in  the My Chart network.  This improves continuity of care & places you in control of your medical record.

## 2012-10-05 NOTE — Progress Notes (Signed)
  Subjective:    Patient ID: Corey Soto, male    DOB: 1965-03-18, 47 y.o.   MRN: [id number]  HPI  He is here for a physical;acute issues denied.     Review of Systems He is on a modified heart healthy diet; he exercises minimally. Specifically he denies chest pain, palpitations, dyspnea, or claudication.  Family history is negative for premature coronary disease. Advanced cholesterol testing not done to date, but LDL 182.8.     Objective:   Physical Exam Gen.:  well-nourished in appearance. Alert, appropriate and cooperative throughout exam.Appears younger than stated age  Head: Normocephalic without obvious abnormalities; goatee ; no alopecia  Eyes: No corneal or conjunctival inflammation noted. Pupils equal round reactive to light and accommodation. Fundal exam is benign without hemorrhages, exudate, papilledema. Extraocular motion intact. Vision grossly normal with lenses Ears: External  ear exam reveals no significant lesions or deformities. Canals clear .TMs normal. Hearing is slightly normal bilaterally. Nose: External nasal exam reveals no deformity or inflammation. Nasal mucosa are pink and moist. No lesions or exudates noted.   Mouth: Oral mucosa and oropharynx reveal no lesions or exudates. Teeth in good repair. Neck: No deformities, masses, or tenderness noted. Range of motion & Thyroid normal. Lungs: Normal respiratory effort; chest expands symmetrically. Lungs are clear to auscultation without rales, wheezes, or increased work of breathing. Heart: Normal rate and rhythm. Normal S1 and S2. No gallop, click, or rub. S4 w/o murmur. Abdomen: Bowel sounds normal; abdomen soft and nontender. No masses, organomegaly or hernias noted. Genitalia: Genitalia normal except for left varices. Prostate is normal without enlargement, asymmetry, nodularity, or induration.                                    Musculoskeletal/extremities: No deformity or scoliosis noted of  the thoracic or lumbar  spine.  No clubbing, cyanosis, edema, or significant extremity  deformity noted. Range of motion normal .Tone & strength  Normal. Joints normal . Nail health good. Able to lie down & sit up w/o help. Negative SLR bilaterally Vascular: Carotid, radial artery, dorsalis pedis and  posterior tibial pulses are full and equal. No bruits present. Neurologic: Alert and oriented x3. Deep tendon reflexes symmetrical and normal.        Skin: Intact without suspicious lesions or rashes. Lymph: No cervical, axillary, or inguinal lymphadenopathy present. Psych: Mood and affect are normal. Normally interactive                                                                                        Assessment & Plan:  #1 comprehensive physical exam; no acute findings  Plan: see Orders  & Recommendations

## 2012-10-11 ENCOUNTER — Other Ambulatory Visit (INDEPENDENT_AMBULATORY_CARE_PROVIDER_SITE_OTHER): Payer: BC Managed Care – PPO

## 2012-10-11 ENCOUNTER — Other Ambulatory Visit: Payer: Self-pay | Admitting: Internal Medicine

## 2012-10-11 DIAGNOSIS — Z0000 Encounter for general adult medical examination without abnormal findings: Secondary | ICD-10-CM

## 2012-10-11 LAB — HEPATIC FUNCTION PANEL
AST: 42 U/L — ABNORMAL HIGH (ref 0–37)
Albumin: 4.1 g/dL (ref 3.5–5.2)
Alkaline Phosphatase: 37 U/L — ABNORMAL LOW (ref 39–117)
Bilirubin, Direct: 0.1 mg/dL (ref 0.0–0.3)
Total Protein: 7 g/dL (ref 6.0–8.3)

## 2012-10-11 LAB — CBC WITH DIFFERENTIAL/PLATELET
Basophils Absolute: 0 10*3/uL (ref 0.0–0.1)
Basophils Relative: 0.5 % (ref 0.0–3.0)
Hemoglobin: 16.3 g/dL (ref 13.0–17.0)
Lymphocytes Relative: 39.3 % (ref 12.0–46.0)
Monocytes Relative: 11.5 % (ref 3.0–12.0)
Neutro Abs: 1.9 10*3/uL (ref 1.4–7.7)
Neutrophils Relative %: 46.2 % (ref 43.0–77.0)
RBC: 5.22 Mil/uL (ref 4.22–5.81)
RDW: 13.1 % (ref 11.5–14.6)

## 2012-10-11 LAB — BASIC METABOLIC PANEL
Calcium: 8.8 mg/dL (ref 8.4–10.5)
Creatinine, Ser: 1.1 mg/dL (ref 0.4–1.5)
GFR: 75.7 mL/min (ref >60.00)
Glucose, Bld: 96 mg/dL (ref 70–99)
Potassium: 3.9 mEq/L (ref 3.5–5.1)
Sodium: 137 mEq/L (ref 135–145)

## 2012-10-12 LAB — NMR LIPOPROFILE WITH LIPIDS
Cholesterol, Total: 219 mg/dL — ABNORMAL HIGH (ref <200)
HDL Particle Number: 31.3 umol/L (ref >=30.5)
HDL Size: 8.3 nm — ABNORMAL LOW (ref >=9.2)
HDL-C: 44 mg/dL (ref >=40)
LDL (calc): 150 mg/dL — ABNORMAL HIGH (ref <100)
LDL Particle Number: 2457 nmol/L — ABNORMAL HIGH (ref <1000)
LDL Size: 20.3 nm — ABNORMAL LOW (ref >20.5)
LP-IR Score: 59 — ABNORMAL HIGH (ref <=45)
Large HDL-P: <1.3 umol/L — ABNORMAL LOW (ref >=4.8)
Triglycerides: 124 mg/dL (ref <150)
VLDL Size: 47 nm — ABNORMAL HIGH (ref <=46.6)

## 2012-10-13 ENCOUNTER — Encounter: Payer: Self-pay | Admitting: *Deleted

## 2012-10-14 ENCOUNTER — Encounter: Payer: Self-pay | Admitting: Internal Medicine

## 2012-10-14 ENCOUNTER — Encounter: Payer: Self-pay | Admitting: *Deleted

## 2012-10-14 NOTE — Progress Notes (Signed)
Lab letter printed and mailed to patient.

## 2012-10-21 ENCOUNTER — Encounter: Payer: BC Managed Care – PPO | Admitting: Internal Medicine

## 2013-07-26 ENCOUNTER — Encounter: Payer: Self-pay | Admitting: Internal Medicine

## 2013-08-02 ENCOUNTER — Ambulatory Visit: Payer: BC Managed Care – PPO | Admitting: Internal Medicine

## 2014-02-12 ENCOUNTER — Ambulatory Visit (INDEPENDENT_AMBULATORY_CARE_PROVIDER_SITE_OTHER): Payer: BLUE CROSS/BLUE SHIELD | Admitting: Medical

## 2014-02-12 ENCOUNTER — Encounter: Payer: Self-pay | Admitting: Medical

## 2014-02-12 VITALS — BP 151/96 | HR 97 | Temp 98.5°F | Ht 66.75 in | Wt 212.0 lb

## 2014-02-12 DIAGNOSIS — H6502 Acute serous otitis media, left ear: Secondary | ICD-10-CM

## 2014-02-12 DIAGNOSIS — J029 Acute pharyngitis, unspecified: Secondary | ICD-10-CM | POA: Insufficient documentation

## 2014-02-12 DIAGNOSIS — H6690 Otitis media, unspecified, unspecified ear: Secondary | ICD-10-CM | POA: Insufficient documentation

## 2014-02-12 DIAGNOSIS — J111 Influenza due to unidentified influenza virus with other respiratory manifestations: Secondary | ICD-10-CM | POA: Insufficient documentation

## 2014-02-12 DIAGNOSIS — R509 Fever, unspecified: Secondary | ICD-10-CM

## 2014-02-12 LAB — POCT RAPID STREP A (OFFICE): Rapid Strep A Screen: NEGATIVE

## 2014-02-12 MED ORDER — AMOXICILLIN-POT CLAVULANATE 875-125 MG PO TABS: 1.0000 | ORAL_TABLET | Freq: Two times a day (BID) | ORAL | Status: DC

## 2014-02-12 MED ORDER — BECLOMETHASONE DIPROPIONATE 40 MCG/ACT IN AERS: 2.0000 | INHALATION_SPRAY | Freq: Two times a day (BID) | RESPIRATORY_TRACT | Status: DC

## 2014-02-12 MED ORDER — OSELTAMIVIR PHOSPHATE 75 MG PO CAPS: 75.0000 mg | ORAL_CAPSULE | Freq: Two times a day (BID) | ORAL | Status: DC

## 2014-02-12 MED ORDER — ALBUTEROL SULFATE HFA 108 (90 BASE) MCG/ACT IN AERS: 2.0000 | INHALATION_SPRAY | Freq: Four times a day (QID) | RESPIRATORY_TRACT | Status: DC | PRN

## 2014-02-12 MED ORDER — BENZONATATE 100 MG PO CAPS: 100.0000 mg | ORAL_CAPSULE | Freq: Three times a day (TID) | ORAL | Status: DC | PRN

## 2014-02-12 NOTE — Progress Notes (Signed)
Subjective:    Patient ID: Corey Soto, male    DOB: 11/06/1965, 49 y.o.   MRN: [id number]  HPI   Pt in today reporting  cough, nasal congestion and runny nose for 2   Days.  Pt about to travel tomorrow. He wants assessment to know what he has prior to the trip.  Associated symptoms( below yes or no)  Fever-102 Saturday. Today 99.6 Chills-no Chest congestion-yes mild. Non productive cough. Sneezing- no Itching eyes- Sore throat- Moderate sore throat. Post-nasal drainage- Wheezing-yes(pt states normally does not wheeze day to day basis. He has hx of asthma. Last used albuterol on saturday. Purulent nasal drainage-NO. Fatigue- yes. Some ha Saturday and Sunday.  No fluvaccine this year.     Review of Systems  Constitutional: Positive for fever and fatigue. Negative for chills.       Quite high.  HENT: Positive for congestion, postnasal drip, rhinorrhea and sore throat. Negative for ear pain.   Respiratory: Positive for cough. Negative for chest tightness and shortness of breath.   Cardiovascular: Negative for chest pain and palpitations.  Gastrointestinal: Negative for abdominal pain, diarrhea and constipation.  Musculoskeletal: Negative for back pain.  Neurological: Positive for headaches.       None today but some last 2 days.  Hematological: Negative for adenopathy. Does not bruise/bleed easily.  Psychiatric/Behavioral: Negative for behavioral problems and confusion.    Past Medical History  Diagnosis Date  . Asthma   . Allergic rhinitis   . Hyperlipidemia     LDL 182.8    History   Social History  . Marital Status: Single    Spouse Name: N/A    Number of Children: N/A  . Years of Education: N/A   Occupational History  . computer work    Social History Main Topics  . Smoking status: Never Smoker   . Smokeless tobacco: Not on file  . Alcohol Use: No  . Drug Use: Not on file  . Sexual Activity: Not on file   Other Topics Concern  . Not on file     Social History Narrative    Past Surgical History  Procedure Laterality Date  . Wisdom tooth extraction      Family History  Problem Relation Age of Onset  . Lung cancer Maternal Grandmother     smoker  . Stroke Daughter     in teens (BCP & smoking)  . Diabetes Neg Hx   . Heart attack Maternal Grandfather     > 55    No Known Allergies  Current Outpatient Prescriptions on File Prior to Visit  Medication Sig Dispense Refill  . albuterol (VENTOLIN HFA) 108 (90 BASE) MCG/ACT inhaler Inhale 2 puffs into the lungs 4 (four) times daily as needed for wheezing. 3 Inhaler 3  . fluticasone (FLONASE) 50 MCG/ACT nasal spray Place 1 spray into the nose 2 (two) times daily as needed for rhinitis. 16 g 2  . mometasone-formoterol (DULERA) 100-5 MCG/ACT AERO 2 puffs then rinse mouth, twice daily- maintenance 3 Inhaler 3  . Multiple Vitamin (MULTIVITAMIN) tablet Take 1 tablet by mouth daily.       No current facility-administered medications on file prior to visit.    BP 151/96 mmHg  Pulse 97  Temp(Src) 98.5 F (36.9 C) (Oral)  Ht 5' 6.75" (1.695 m)  Wt 212 lb (96.163 kg)  BMI 33.47 kg/m2  SpO2 96%      Objective:   Physical Exam   General  Mental Status -  Alert. General Appearance - Well groomed. Not in acute distress.  Skin Rashes- No Rashes.  HEENT Head- Normal. Ear Auditory Canal - Left- Normal. Right - Normal.Tympanic Membrane- Left- upper 1/3 bright red Right- Normal. Eye Sclera/Conjunctiva- Left- Normal. Right- Normal. Nose & Sinuses Nasal Mucosa- Left-  Boggy and Congested. Right-  Boggy and  Congested.Bilateral no maxillary and no frontal sinus pressure. Mouth & Throat Lips: Upper Lip- Normal: no dryness, cracking, pallor, cyanosis, or vesicular eruption. Lower Lip-Normal: no dryness, cracking, pallor, cyanosis or vesicular eruption. Buccal Mucosa- Bilateral- No Aphthous ulcers. Oropharynx- No Discharge or Erythema. Tonsils: Characteristics- Bilateral-  moderate  Erythema or Congestion. Size/Enlargement- Bilateral- No enlargement. Discharge- bilateral-None.  Neck Neck- Supple. No Masses. Non tender submandibular nodes.   Chest and Lung Exam Auscultation: Breath Sounds:-Clear even and unlabored.  Cardiovascular Auscultation:Rythm- Regular, rate and rhythm. Murmurs & Other Heart Sounds:Ausculatation of the heart reveal- No Murmurs.  Lymphatic Head & Neck General Head & Neck Lymphatics: Bilateral: Description- No Localized lymphadenopathy.         Assessment & Plan:

## 2014-02-12 NOTE — Progress Notes (Signed)
Pre visit review using our clinic review tool, if applicable. No additional management support is needed unless otherwise documented below in the visit note. 

## 2014-02-12 NOTE — Patient Instructions (Addendum)
You have the flu. I am treating you with tamiflu. Rest, hydrate and take tylenol for fever. Alternate ibuprofen as discussed  if necessary for body ache or fever. You should gradually improve.   You to appear to have secondary infection as lt om. For that I will prescribe you augmentin.  For you cough, I am prescribing benzonatate.  For you wheezing rx of albuterol and qvar.  Follow up in 7 days or as needed.      I

## 2014-02-12 NOTE — Assessment & Plan Note (Signed)
You have the flu. I am treating you with tamiflu. Rest, hydrate and take tylenol for fever. Alternate ibuprofen as discussed  if necessary for body ache or fever. You should gradually improve.   You to appear to have secondary infection as lt om. For that I will prescribe you augmentin.  For you cough, I am prescribing benzonatate.  For you wheezing rx of albuterol and qvar.

## 2014-05-11 ENCOUNTER — Telehealth: Payer: Self-pay | Admitting: Internal Medicine

## 2014-05-11 MED ORDER — MOMETASONE FURO-FORMOTEROL FUM 100-5 MCG/ACT IN AERO: INHALATION_SPRAY | RESPIRATORY_TRACT | Status: DC

## 2014-05-11 MED ORDER — ALBUTEROL SULFATE HFA 108 (90 BASE) MCG/ACT IN AERS: 2.0000 | INHALATION_SPRAY | Freq: Four times a day (QID) | RESPIRATORY_TRACT | Status: DC | PRN

## 2014-05-11 MED ORDER — AZITHROMYCIN 250 MG PO TABS: ORAL_TABLET | ORAL | Status: DC

## 2014-05-11 NOTE — Telephone Encounter (Signed)
Called and spoke to pt. Informed pt of the recs per CY. Rx sent to preferred pharmacy. Pt verbalized understanding and denied any further questions or concerns at this time.  

## 2014-05-11 NOTE — Telephone Encounter (Signed)
[facility]ill [facility]  Offer Z pak

## 2014-05-11 NOTE — Telephone Encounter (Signed)
Called and spoke to pt. Pt has not been seen since 07/2012. Pt has pending appt with 05/31/2014. Pt c/o sore throat, prod cough (unsure of color, pt swallows mucus), chest congestion, increase in SOB with activity, wheezing x 1 week. Pt denies CP/tightness and f/c/s. Pt stated his s/s worsen at night. Pt is out of Dulera and albuterol inhaler. Rx for albuterol sent to preferred pharmacy.   CY please advise if pt can be worked in sooner or if recs can be made to hold pt over till appt. Please advise if ok to send refill of Dulera. Thanks.   No Known Allergies  Current Outpatient Prescriptions on File Prior to Visit  Medication Sig Dispense Refill  . amoxicillin-clavulanate (AUGMENTIN) 875-125 MG per tablet Take 1 tablet by mouth 2 (two) times daily. 20 tablet 0  . beclomethasone (QVAR) 40 MCG/ACT inhaler Inhale 2 puffs into the lungs 2 (two) times daily. 1 Inhaler 2  . benzonatate (TESSALON) 100 MG capsule Take 1 capsule (100 mg total) by mouth 3 (three) times daily as needed. 21 capsule 0  . fluticasone (FLONASE) 50 MCG/ACT nasal spray Place 1 spray into the nose 2 (two) times daily as needed for rhinitis. 16 g 2  . mometasone-formoterol (DULERA) 100-5 MCG/ACT AERO 2 puffs then rinse mouth, twice daily- maintenance 3 Inhaler 3  . Multiple Vitamin (MULTIVITAMIN) tablet Take 1 tablet by mouth daily.      Marland Kitchen. oseltamivir (TAMIFLU) 75 MG capsule Take 1 capsule (75 mg total) by mouth 2 (two) times daily. 10 capsule 0   No current facility-administered medications on file prior to visit.

## 2014-05-31 ENCOUNTER — Encounter: Payer: Self-pay | Admitting: Internal Medicine

## 2014-05-31 ENCOUNTER — Ambulatory Visit (INDEPENDENT_AMBULATORY_CARE_PROVIDER_SITE_OTHER): Payer: BLUE CROSS/BLUE SHIELD | Admitting: Internal Medicine

## 2014-05-31 VITALS — BP 120/78 | HR 72 | Ht 66.0 in | Wt 216.0 lb

## 2014-05-31 DIAGNOSIS — J45998 Other asthma: Secondary | ICD-10-CM | POA: Diagnosis not present

## 2014-05-31 DIAGNOSIS — J302 Other seasonal allergic rhinitis: Secondary | ICD-10-CM

## 2014-05-31 DIAGNOSIS — J3089 Other allergic rhinitis: Principal | ICD-10-CM

## 2014-05-31 DIAGNOSIS — J309 Allergic rhinitis, unspecified: Secondary | ICD-10-CM

## 2014-05-31 MED ORDER — MOMETASONE FURO-FORMOTEROL FUM 100-5 MCG/ACT IN AERO: INHALATION_SPRAY | RESPIRATORY_TRACT | Status: DC

## 2014-05-31 MED ORDER — ALBUTEROL SULFATE HFA 108 (90 BASE) MCG/ACT IN AERS: 2.0000 | INHALATION_SPRAY | Freq: Four times a day (QID) | RESPIRATORY_TRACT | Status: DC | PRN

## 2014-05-31 NOTE — Patient Instructions (Signed)
Refill scripts sent for albuterol rescue inhaler and [facility]  Ok to use otc Flonase when needed  Please cal if we can help

## 2014-05-31 NOTE — Progress Notes (Signed)
Patient ID: Corey Soto, male    DOB: 03/19/1965, [age] y.o.   MRN: [id number]  HPI 06/27/10- Asthma, allergic rhintis Last here 02/15/10- Noted more nasal congestion in the Spring, especially while supine but better now.  Denies any significant cough wheeze or chest tightness Not using Advair regulary.  Aware of asthma. Previous allergy testing positive and was on shots for years. Thinks he does react some to his pets.  PFT- WNL FEV1/FVC 0.77  02/18/11- 49yo M never smoker, followed for allergic rhinitis, asthma Returns off antihistamines for allergy skin tests.  Since last here he has noted occasional chest tightness with little wheezing. Occasional use of his rescue inhaler does help. Perennial nasal congestion is worse at night and he blows out a lot in the morning. Has been taking Allegra-D. Has not felt badly in the last few days off of antihistamines for skin testing. Allergy skin testing: Strongly positive for grass pollens and significant reactions for weed pollens, tree pollens, dust mite, feathers  07/23/11- 49yo M never smoker, followed for allergic rhinitis, asthma Pt states overall he sees improvement in allergies since last OV. Pt states he has not taken the Allegra-D 24 in the last 3-574months and has been out of the Advair x 2 months.  We discussed his skin test reactions from last visit. He is quite comfortable with minimal treatment now. We are not sure why this year is much better than previous.  08/02/12- 49yo M never smoker, followed for allergic rhinitis, asthma FOLLOWS [id number]:doing well now,had allergy problems in spring,has pnd keeps this, denies sob or cough,needs to discuss albuterol and advair not covered by insurance,needs something else Spring pollen season was worse this year but he feels well now. Last year was unusually good by comparison.  05/31/14- 49yo M never smoker, followed for allergic rhinitis, asthma FOLLOWS FOR: Recently sick-got Zpak from us and feeling better  overall now. Bronchitis in April responded to Z-Pak. Pollen season "rough" but also better now. Not using Flonase. [facility], Ventolin once a week.  ROS-see HPI Constitutional:   No-   weight loss, night sweats, fevers, chills, fatigue, lassitude. HEENT:   No-  headaches, difficulty swallowing, tooth/dental problems, sore throat,       No-  sneezing, itching, ear ache, +nasal congestion, post nasal drip,  CV:  No-   chest pain, orthopnea, PND, swelling in lower extremities, anasarca, izziness, palpitations Resp: No-   shortness of breath with exertion or at rest.              No-   productive cough,  No non-productive cough,  No- coughing up of blood.              No-   change in color of mucus.  No- wheezing.   Skin: No-   rash or lesions. GI:  No-   heartburn, indigestion, abdominal pain, nausea, vomiting,  GU: . MS:  No-   joint pain or swelling.  Neuro-     nothing unusual Psych:  No- change in mood or affect. No depression or anxiety.  No memory loss.   Objective:  OBJ- Physical Exam General- Alert, Oriented, Affect-appropriate, Distress- none acute, overweight but looks comfortable Skin- rash-none, lesions- none, excoriation- none Lymphadenopathy- none Head- atraumatic            Eyes- Gross vision intact, PERRLA, conjunctivae and secretions clear            Ears- Hearing, canals-normal  Nose- +pale mucosa, no-Septal dev, mucus, polyps, erosion, perforation             Throat- Mallampati III , mucosa clear , drainage- none, tonsils- atrophic Neck- flexible , trachea midline, no stridor , thyroid nl, carotid no bruit Chest - symmetrical excursion , unlabored           Heart/CV- RRR , no murmur , no gallop  , no rub, nl s1 s2                           - JVD- none , edema- none, stasis changes- none, varices- none           Lung- clear to P&A, wheeze- none, cough- none , dullness-none, rub- none           Chest wall-  Abd-  Br/ Gen/ Rectal- Not done, not  indicated Extrem- cyanosis- none, clubbing, none, atrophy- none, strength- nl Neuro- grossly intact to observation

## 2014-06-18 NOTE — Assessment & Plan Note (Signed)
Adequate symptom control with antihistamines this spring

## 2014-06-18 NOTE — Assessment & Plan Note (Signed)
Acute asthmatic bronchitis episode by description, now resolved after Z-Pak. Plan-[facility]

## 2015-05-31 ENCOUNTER — Ambulatory Visit: Payer: BLUE CROSS/BLUE SHIELD | Admitting: Internal Medicine

## 2015-06-03 ENCOUNTER — Encounter: Payer: Self-pay | Admitting: Internal Medicine

## 2015-06-03 ENCOUNTER — Ambulatory Visit (INDEPENDENT_AMBULATORY_CARE_PROVIDER_SITE_OTHER): Payer: BLUE CROSS/BLUE SHIELD | Admitting: Internal Medicine

## 2015-06-03 VITALS — BP 118/68 | HR 76 | Ht 66.0 in | Wt 221.6 lb

## 2015-06-03 DIAGNOSIS — J302 Other seasonal allergic rhinitis: Secondary | ICD-10-CM

## 2015-06-03 DIAGNOSIS — J3089 Other allergic rhinitis: Secondary | ICD-10-CM

## 2015-06-03 DIAGNOSIS — J45998 Other asthma: Secondary | ICD-10-CM

## 2015-06-03 MED ORDER — AZELASTINE-FLUTICASONE 137-50 MCG/ACT NA SUSP: 2.0000 | Freq: Every day | NASAL | Status: DC

## 2015-06-03 MED ORDER — ALBUTEROL SULFATE HFA 108 (90 BASE) MCG/ACT IN AERS: 2.0000 | INHALATION_SPRAY | Freq: Four times a day (QID) | RESPIRATORY_TRACT | Status: DC | PRN

## 2015-06-03 MED ORDER — MOMETASONE FURO-FORMOTEROL FUM 100-5 MCG/ACT IN AERO: INHALATION_SPRAY | RESPIRATORY_TRACT | Status: DC

## 2015-06-03 NOTE — Progress Notes (Signed)
Patient ID: Corey Soto, male    DOB: 10/09/1965, 50 y.o.   MRN: [id number]  HPI 06/27/10- Asthma, allergic rhintis Last here 02/15/10- Noted more nasal congestion in the Spring, especially while supine but better now.  Denies any significant cough wheeze or chest tightness Not using Advair regulary.  Aware of asthma. Previous allergy testing positive and was on shots for years. Thinks he does react some to his pets.  PFT- WNL FEV1/FVC 0.77  02/18/11- 46yo M never smoker, followed for allergic rhinitis, asthma Returns off antihistamines for allergy skin tests.  Since last here he has noted occasional chest tightness with little wheezing. Occasional use of his rescue inhaler does help. Perennial nasal congestion is worse at night and he blows out a lot in the morning. Has been taking Allegra-D. Has not felt badly in the last few days off of antihistamines for skin testing. Allergy skin testing: Strongly positive for grass pollens and significant reactions for weed pollens, tree pollens, dust mite, feathers  07/23/11- 46yo M never smoker, followed for allergic rhinitis, asthma Pt states overall he sees improvement in allergies since last OV. Pt states he has not taken the Allegra-D 24 in the last 3-72months and has been out of the Advair x 2 months.  We discussed his skin test reactions from last visit. He is quite comfortable with minimal treatment now. We are not sure why this year is much better than previous.  08/02/12- 12yo M never smoker, followed for allergic rhinitis, asthma FOLLOWS [id number] well now,had allergy problems in spring,has pnd keeps this, denies sob or cough,needs to discuss albuterol and advair not covered by insurance,needs something else Spring pollen season was worse this year but he feels well now. Last year was unusually good by comparison.  05/31/14- 66yo M never smoker, followed for allergic rhinitis, asthma FOLLOWS FOR: Recently sick-got Zpak from [place] and feeling better  overall now. Bronchitis in April responded to Z-Pak. Pollen season "rough" but also better now. Not using Flonase. Using [facility] once daily, Ventolin once a week.  06/03/2015-50 year old male never smoker followed for allergic rhinitis, asthma FOLLOWS FOR: Seasonal changes have been rough-few headaches,runny nose, itchy and watery eyes. Wheezing and SOB when very active. Has been using both Dulera(2 puffs QD) and Albuterol HFA daily.    ROS-see HPI Constitutional:   No-   weight loss, night sweats, fevers, chills, fatigue, lassitude. HEENT:   No-  headaches, difficulty swallowing, tooth/dental problems, sore throat,       No-  sneezing, itching, ear ache, +nasal congestion, post nasal drip,  CV:  No-   chest pain, orthopnea, PND, swelling in lower extremities, anasarca, izziness, palpitations Resp: No-   shortness of breath with exertion or at rest.              No-   productive cough,  No non-productive cough,  No- coughing up of blood.              No-   change in color of mucus.  No- wheezing.   Skin: No-   rash or lesions. GI:  No-   heartburn, indigestion, abdominal pain, nausea, vomiting,  GU: . MS:  No-   joint pain or swelling.  Neuro-     nothing unusual Psych:  No- change in mood or affect. No depression or anxiety.  No memory loss.   Objective:  OBJ- Physical Exam General- Alert, Oriented, Affect-appropriate, Distress- none acute, overweight but looks comfortable Skin- rash-none, lesions- none, excoriation-  none Lymphadenopathy- none Head- atraumatic            Eyes- Gross vision intact, PERRLA, conjunctivae and secretions clear            Ears- Hearing, canals-normal            Nose- +pale mucosa, no-Septal dev, mucus, polyps, erosion, perforation             Throat- Mallampati III , mucosa clear , drainage- none, tonsils- atrophic Neck- flexible , trachea midline, no stridor , thyroid nl, carotid no bruit Chest - symmetrical excursion , unlabored           Heart/CV- RRR  , no murmur , no gallop  , no rub, nl s1 s2                           - JVD- none , edema- none, stasis changes- none, varices- none           Lung- clear to P&A, wheeze- none, cough- none , dullness-none, rub- none           Chest wall-  Abd-  Br/ Gen/ Rectal- Not done, not indicated Extrem- cyanosis- none, clubbing, none, atrophy- none, strength- nl Neuro- grossly intact to observation

## 2015-06-03 NOTE — Patient Instructions (Signed)
Refill scripts for ventolin and Dulera sent  [facility] 2 puffs, then rinse mouth, twice daily when needed  Sample Dymista nasal spray    Try 1-2 puffs each nostril once daily at bedtime.  See what you think compared with Flonase  Please call as needed

## 2017-08-15 ENCOUNTER — Telehealth (HOSPITAL_COMMUNITY): Payer: Self-pay | Admitting: Family Medicine

## 2017-08-15 ENCOUNTER — Encounter (HOSPITAL_COMMUNITY): Payer: Self-pay

## 2017-08-15 ENCOUNTER — Ambulatory Visit (INDEPENDENT_AMBULATORY_CARE_PROVIDER_SITE_OTHER): Payer: Commercial Managed Care - PPO

## 2017-08-15 ENCOUNTER — Ambulatory Visit (HOSPITAL_COMMUNITY)
Admission: EM | Admit: 2017-08-15 | Discharge: 2017-08-15 | Disposition: A | Discharge status: Home / Self Care | Payer: Commercial Managed Care - PPO | Attending: Family Medicine | Admitting: Family Medicine

## 2017-08-15 DIAGNOSIS — Z801 Family history of malignant neoplasm of trachea, bronchus and lung: Secondary | ICD-10-CM | POA: Diagnosis not present

## 2017-08-15 DIAGNOSIS — Z8249 Family history of ischemic heart disease and other diseases of the circulatory system: Secondary | ICD-10-CM | POA: Diagnosis not present

## 2017-08-15 DIAGNOSIS — J45909 Unspecified asthma, uncomplicated: Secondary | ICD-10-CM | POA: Insufficient documentation

## 2017-08-15 DIAGNOSIS — M25562 Pain in left knee: Secondary | ICD-10-CM

## 2017-08-15 DIAGNOSIS — Z9889 Other specified postprocedural states: Secondary | ICD-10-CM | POA: Insufficient documentation

## 2017-08-15 DIAGNOSIS — M25462 Effusion, left knee: Secondary | ICD-10-CM | POA: Diagnosis not present

## 2017-08-15 DIAGNOSIS — Z823 Family history of stroke: Secondary | ICD-10-CM | POA: Diagnosis not present

## 2017-08-15 DIAGNOSIS — Z79899 Other long term (current) drug therapy: Secondary | ICD-10-CM | POA: Diagnosis not present

## 2017-08-15 LAB — SYNOVIAL CELL COUNT + DIFF, W/ CRYSTALS
Crystals, Fluid: NONE SEEN
EOSINOPHILS-SYNOVIAL: 0 % (ref 0–1)
LYMPHOCYTES-SYNOVIAL FLD: 0 % (ref 0–20)
MONOCYTE-MACROPHAGE-SYNOVIAL FLUID: 5 % — AB (ref 50–90)
Neutrophil, Synovial: 95 % — ABNORMAL HIGH (ref 0–25)
WBC, Synovial: 32375 /mm3 — ABNORMAL HIGH (ref 0–200)

## 2017-08-15 MED ORDER — IBUPROFEN 800 MG PO TABS: 800.0000 mg | ORAL_TABLET | Freq: Three times a day (TID) | ORAL | 0 refills | Status: DC | PRN

## 2017-08-15 MED ORDER — METHYLPREDNISOLONE ACETATE 80 MG/ML IJ SUSP
INTRAMUSCULAR | Status: AC
  Filled 2017-08-15: qty 1

## 2017-08-15 NOTE — ED Triage Notes (Signed)
Pt presents with left knee pain for 1 week after working out. Pain is now a lot worse, pt has been using crutches at home. Left knee is swollen.

## 2017-08-15 NOTE — ED Provider Notes (Addendum)
[facility]    CSN: [id number] Arrival date & time: 08/15/17  1025     History   Chief Complaint Chief Complaint  Patient presents with  . Leg Pain    HPI Corey Soto is a 52 y.o. male.   HPI  Patient is here with left knee pain.  Is very swollen.  Painful with any weightbearing.  He is here on crutches.  The swelling and pain started yesterday.  States he had some soreness in his knee for about a week.  He assumes is because of his exercise routine.  He was doing some machine leg lifts, walking on a treadmill, riding a bike, and activity at a gym and increased his exercise level.  No twisting injury or fall.  No prior problems with his knee.  He states that it has a "click" with range of motion.  The knee is very painful at this point, he feels unsteady, and like it may buckle.  He has used ice and heat with no improvement.  Past Medical History:  Diagnosis Date  . Allergic rhinitis   . Asthma   . Hyperlipidemia    LDL 182.8    Patient Active Problem List   Diagnosis Date Noted  . Fever 02/12/2014  . Acute pharyngitis 02/12/2014  . Influenza 02/12/2014  . Otitis media 02/12/2014  . Nonspecific elevation of levels of transaminase or lactic acid dehydrogenase (LDH) 10/05/2012  . Nonspecific abnormal electrocardiogram (ECG) (EKG) 10/05/2012  . Mixed hyperlipidemia 03/09/2010  . Seasonal and perennial allergic rhinitis 03/25/2009  . Allergic-infective asthma 03/25/2009  . DIPLOPIA, HX OF 03/25/2009    Past Surgical History:  Procedure Laterality Date  . WISDOM TOOTH EXTRACTION         Home Medications    Prior to Admission medications   Medication Sig Start Date End Date Taking? Authorizing Provider  albuterol (VENTOLIN HFA) 108 (90 Base) MCG/ACT inhaler Inhale 2 puffs into the lungs 4 (four) times daily as needed for wheezing. 06/03/15  Yes Young, Joni Fears D, MD  Multiple Vitamin (MULTIVITAMIN) tablet Take 1 tablet by mouth daily.     Yes [provider]  ibuprofen (ADVIL,MOTRIN) 800 MG tablet Take 1 tablet (800 mg total) by mouth every 8 (eight) hours as needed for moderate pain. 08/15/17   Eustace Moore, MD    Family History Family History  Problem Relation Age of Onset  . Lung cancer Maternal Grandmother        smoker  . Stroke Daughter        in teens (BCP & smoking)  . Heart attack Maternal Grandfather        > 55  . Healthy Mother   . Heart disease Father   . Diabetes Neg Hx     Social History Social History   Tobacco Use  . Smoking status: Never Smoker  . Smokeless tobacco: Never Used  Substance Use Topics  . Alcohol use: No  . Drug use: Not on file     Allergies   Patient has no known allergies.   Review of Systems Review of Systems  Constitutional: Negative for chills and fever.  HENT: Negative for ear pain and sore throat.   Eyes: Negative for pain and visual disturbance.  Respiratory: Negative for cough and shortness of breath.   Cardiovascular: Negative for chest pain and palpitations.  Gastrointestinal: Negative for abdominal pain and vomiting.  Genitourinary: Negative for dysuria and hematuria.  Musculoskeletal: Positive for arthralgias, gait problem and  joint swelling. Negative for back pain.       Left knee  Skin: Negative for color change and rash.  Neurological: Negative for seizures and syncope.  All other systems reviewed and are negative.    Physical Exam Triage Vital Signs ED Triage Vitals  Enc Vitals Group     BP 08/15/17 1114 (!) 122/93     Pulse Rate 08/15/17 1114 99     Resp 08/15/17 1114 18     Temp 08/15/17 1114 (!) 97.2 F (36.2 C)     Temp src --      SpO2 08/15/17 1114 100 %     Weight --      Height --      Head Circumference --      Peak Flow --      Pain Score 08/15/17 1115 8     Pain Loc --      Pain Edu? --      Excl. in GC? --    No data found.  Updated Vital Signs BP (!) 122/93   Pulse 99   Temp (!) 97.2 F (36.2 C)   Resp 18   SpO2  100%  :     Physical Exam  Constitutional: He appears well-developed and well-nourished. No distress.  HENT:  Head: Normocephalic and atraumatic.  Mouth/Throat: Oropharynx is clear and moist.  Eyes: Pupils are equal, round, and reactive to light. Conjunctivae are normal.  Neck: Normal range of motion.  Cardiovascular: Normal rate, regular rhythm and normal heart sounds.  Pulmonary/Chest: Effort normal and breath sounds normal. No respiratory distress.  Abdominal: Soft. He exhibits no distension.  Musculoskeletal: Normal range of motion. He exhibits no edema.  Left knee has large effusion.  No tenderness to palpation.  No instability to ligamentous testing.  Lacks full flexion extension secondary to pain/effusion  Neurological: He is alert.  Skin: Skin is warm and dry.     UC Treatments / Results  Labs (all labs ordered are listed, but only abnormal results are displayed) Labs Reviewed  SYNOVIAL CELL COUNT + DIFF, W/ CRYSTALS - Abnormal; Notable for the following components:      Result Value   Color, Synovial AMBER (*)    Appearance-Synovial TURBID (*)    WBC, Synovial 32,375 (*)    Neutrophil, Synovial 95 (*)    Monocyte-Macrophage-Synovial Fluid 5 (*)    All other components within normal limits    EKG None  Radiology Dg Knee Complete 4 Views Left  Result Date: 08/15/2017 CLINICAL DATA:  Left knee injury 1 week ago while working out. Worsening knee pain. Initial encounter. EXAM: LEFT KNEE - COMPLETE 4+ VIEW COMPARISON:  None. FINDINGS: No evidence of fracture or dislocation. Small to moderate knee joint effusion noted. No evidence of arthropathy or other focal bone abnormality. Soft tissues are unremarkable. IMPRESSION: Small to moderate knee joint effusion.  No osseous abnormality. Electronically Signed   By: Myles RosenthalJohn  Stahl M.D.   On: 08/15/2017 13:14   I called the orthopedic doctor on call to discuss the x-rays and tap results.  The white count of the synovial fluid is  32,375.  This is higher than I expected for an inflammatory effusion.  It is not high enough to make the diagnosis of a septic joint.  Clinically, the patient did not have a septic joint.  He did recommend that I culture the fluid so an order was placed and I called the microbiology laboratory at [facility] to make  sure the specimen was still available for culture.  We will call the patient the culture is positive. Procedures Join Aspiration/Injection Date/Time: 08/15/2017 7:11 PM Performed by: Eustace Moore, MD Authorized by: Eustace Moore, MD   Consent:    Consent obtained:  Verbal   Consent given by:  Patient and spouse   Risks discussed:  Infection, bleeding and incomplete drainage   Alternatives discussed:  No treatment Location:    Location:  Knee   Knee:  L knee Anesthesia (see MAR for exact dosages):    Anesthesia method:  Local infiltration   Local anesthetic:  Lidocaine 1% w/o epi Procedure details:    Preparation: Patient was prepped and draped in usual sterile fashion     Needle gauge:  18 G   Ultrasound guidance: no     Approach:  Lateral   Aspirate amount:  60 cc   Aspirate characteristics:  Clear, serous and yellow   Steroid injected: yes     Specimen collected: yes   Post-procedure details:    Dressing:  Adhesive bandage   Patient tolerance of procedure:  Tolerated well, no immediate complications Comments:     Ace wrap was placed.  Knee immobilizer placed.  Patient is on crutches.   (including critical care time)  Medications Ordered in UC Medications - No data to display  Initial Impression / Assessment and Plan / UC Course  I have reviewed the triage vital signs and the nursing notes.  Pertinent labs & imaging results that were available during my care of the patient were reviewed by me and considered in my medical decision making (see chart for details).     Final Clinical Impressions(s) / UC Diagnoses   Final diagnoses:  Knee  effusion, left  Acute pain of left knee     Discharge Instructions     Ice to knee for 20 minutes every 2-4 hours Up as tolerated.  Limit walking and weight bearing on painful knee Wear brace for support Gently work on range of motion Take ibuprofen for pain and inflammation.  Take with food See orthopedist if not improved in 7 to 10 days     ED Prescriptions    Medication Sig Dispense Auth. Provider   ibuprofen (ADVIL,MOTRIN) 800 MG tablet Take 1 tablet (800 mg total) by mouth every 8 (eight) hours as needed for moderate pain. 90 tablet Eustace Moore, MD     Controlled Substance Prescriptions [place] Controlled Substance Registry consulted? Not Applicable   Eustace Moore, MD 08/15/17 Angelene Giovanni    Eustace Moore, MD 08/15/17 [phone]

## 2017-08-15 NOTE — Discharge Instructions (Addendum)
Ice to knee for 20 minutes every 2-4 hours Up as tolerated.  Limit walking and weight bearing on painful knee Wear brace for support Gently work on range of motion Take ibuprofen for pain and inflammation.  Take with food See orthopedist if not improved in 7 to 10 days

## 2017-08-15 NOTE — Telephone Encounter (Signed)
Called to check on patient.  White count in synovial fluid mildly elevated.  Culture ordered.  Left message

## 2017-08-19 LAB — BODY FLUID CULTURE: Culture: NO GROWTH

## 2017-09-01 ENCOUNTER — Ambulatory Visit (INDEPENDENT_AMBULATORY_CARE_PROVIDER_SITE_OTHER): Payer: Commercial Managed Care - PPO | Admitting: Family

## 2017-09-01 ENCOUNTER — Encounter (INDEPENDENT_AMBULATORY_CARE_PROVIDER_SITE_OTHER): Payer: Self-pay | Admitting: Family

## 2017-09-01 VITALS — Ht 66.0 in | Wt 221.0 lb

## 2017-09-01 DIAGNOSIS — M25562 Pain in left knee: Secondary | ICD-10-CM

## 2017-09-01 NOTE — Progress Notes (Signed)
Office Visit Note   Patient: Corey Soto           Date of Birth: 11/24/1965           MRN: [id number] Visit Date: 09/01/2017              Requested by: No referring provider defined for this encounter. PCP: Patient, No Pcp Per  Chief Complaint  Patient presents with  . Left Knee - Pain    S/p urgent care 08/15/17      HPI: The patient is a 52 year old gentleman who presents today complaining of left knee pain.  He has increased his exercise over the last several weeks to months.  Walking up to 6 miles daily.  Did have an incident of loss of flexion and swelling presented to the emergency department.  Had an effusion drained on August 4.  Did have a cortisone injection at that time.  Since then has been pain-free has resumed his exercise of the lower activity wondering what to do moving forward.  Wonders if he may have an underlying issue.  Has had intermittent locking.  Assessment & Plan: Visit Diagnoses:  1. Acute pain of left knee     Plan: Reassurance provided.  He will follow-up in the office as needed.  Have discussed options such as further repeat injections.  If ongoing future issues discussed possibility of MRI evaluate for meniscal tear.  Follow-Up Instructions: Return if symptoms worsen or fail to improve.   Left Knee Exam   Muscle Strength  The patient has normal left knee strength.  Tenderness  The patient is experiencing no tenderness.   Range of Motion  The patient has normal left knee ROM.  Tests  Varus: negative Valgus: negative  Other  Erythema: absent Sensation: normal Swelling: none Effusion: no effusion present      Patient is alert, oriented, no adenopathy, well-dressed, normal affect, normal respiratory effort.   Imaging: No results found. No images are attached to the encounter.  Labs: Lab Results  Component Value Date   HGBA1C 5.9 03/25/2009   REPTSTATUS 08/19/2017 FINAL 08/15/2017   GRAMSTAIN  08/15/2017    FEW WBC  PRESENT, PREDOMINANTLY PMN NO ORGANISMS SEEN    CULT  08/15/2017    NO GROWTH 3 DAYS Performed at [facility], [address] [place], [place] [id number]      Lab Results  Component Value Date   ALBUMIN 4.1 10/11/2012   ALBUMIN 4.2 03/25/2009    Body mass index is 35.67 kg/m.  Orders:  No orders of the defined types were placed in this encounter.  No orders of the defined types were placed in this encounter.    Procedures: No procedures performed  Clinical Data: No additional findings.  ROS:  All other systems negative, except as noted in the HPI. Review of Systems  Constitutional: Negative for chills and fever.  Musculoskeletal: Positive for arthralgias. Negative for joint swelling.    Objective: Vital Signs: Ht 5\' 6"  (1.676 m)   Wt 221 lb (100.2 kg)   BMI 35.67 kg/m   Specialty Comments:  No specialty comments available.  PMFS History: Patient Active Problem List   Diagnosis Date Noted  . Fever 02/12/2014  . Acute pharyngitis 02/12/2014  . Influenza 02/12/2014  . Otitis media 02/12/2014  . Nonspecific elevation of levels of transaminase or lactic acid dehydrogenase (LDH) 10/05/2012  . Nonspecific abnormal electrocardiogram (ECG) (EKG) 10/05/2012  . Mixed hyperlipidemia 03/09/2010  . Seasonal and perennial  allergic rhinitis 03/25/2009  . Allergic-infective asthma 03/25/2009  . DIPLOPIA, HX OF 03/25/2009   Past Medical History:  Diagnosis Date  . Allergic rhinitis   . Asthma   . Hyperlipidemia    LDL 182.8    Family History  Problem Relation Age of Onset  . Lung cancer Maternal Grandmother        smoker  . Stroke Daughter        in teens (BCP & smoking)  . Heart attack Maternal Grandfather        > 55  . Healthy Mother   . Heart disease Father   . Diabetes Neg Hx     Past Surgical History:  Procedure Laterality Date  . WISDOM TOOTH EXTRACTION     Social History   Occupational History  . Occupation: computer work  Tobacco  Use  . Smoking status: Never Smoker  . Smokeless tobacco: Never Used  Substance and Sexual Activity  . Alcohol use: No  . Drug use: Not on file  . Sexual activity: Not on file

## 2017-09-12 ENCOUNTER — Other Ambulatory Visit: Payer: Self-pay | Admitting: Family Medicine

## 2018-04-20 ENCOUNTER — Telehealth: Payer: Commercial Managed Care - PPO | Admitting: Family

## 2018-04-20 DIAGNOSIS — J45998 Other asthma: Secondary | ICD-10-CM

## 2018-04-20 MED ORDER — PREDNISONE 20 MG PO TABS: 40.0000 mg | ORAL_TABLET | Freq: Every day | ORAL | 0 refills | Status: AC

## 2018-04-20 MED ORDER — ALBUTEROL SULFATE HFA 108 (90 BASE) MCG/ACT IN AERS: 2.0000 | INHALATION_SPRAY | Freq: Four times a day (QID) | RESPIRATORY_TRACT | 3 refills | Status: DC | PRN

## 2018-04-20 NOTE — Progress Notes (Signed)
E Visit for Asthma  Based on what you have shared with me, it looks like you may have a flare up of your asthma.  Asthma is a chronic (ongoing) lung disease which results in airway obstruction, inflammation and hyper-responsiveness.   Asthma symptoms vary from person to person, with common symptoms including nighttime awakening and decreased ability to participate in normal activities as a result of shortness of breath. It is often triggered by changes in weather, changes in the season, changes in air temperature, or inside (home, school, daycare or work) allergens such as animal dander, mold, mildew, woodstoves or cockroaches.   It can also be triggered by hormonal changes, extreme emotion, physical exertion or an upper respiratory tract illness.     It is important to identify the trigger, and then eliminate or avoid the trigger if possible.   If you have been prescribed medications to be taken on a regular basis, it is important to follow the asthma action plan and to follow guidelines to adjust medication in response to increasing symptoms of decreased peak expiratory flow rate  Treatment: I have prescribed: Albuterol (Proventil HFA; Ventolin HFA) 108 (90 Base) MCG/ACT Inhaler 2 puffs into the lungs every six hours as needed for wheezing or shortness of breath and Prednisone 40mg  by mouth per day for 5  Days.  Your prescription was sent to [facility] in [place], [place].   Approximately 5 minutes was spent documenting and reviewing patient's chart.   HOME CARE . Only take medications as instructed by your medical team. . Consider wearing a mask or scarf to improve breathing air temperature have been shown to decrease irritation and decrease exacerbations . Get rest. . Taking a steamy shower or using a humidifier may help nasal congestion sand ease sore throat pain. You can place  a towel over your head and breathe in the steam from hot water coming from a faucet. . Using a saline nasal spray works much the same way.  . Cough drops, hare candies and sore throat lozenges may ease your cough.  . Avoid close contacts especially the very you and the elderly . Cover your mouth if you cough or sneeze . Always remember to wash your hands.    GET HELP RIGHT AWAY IF: . You develop worsening symptoms; breathlessness at rest, drowsy, confused or agitated, unable to speak in full sentences . You have coughing fits . You develop a severe headache or visual changes . You develop shortness of breath, difficulty breathing or start having chest pain . Your symptoms persist after you have completed your treatment plan . If your symptoms do not improve within 10 days  MAKE SURE YOU . Understand these instructions. . Will watch your condition. . Will get help right away if you are not doing well or get worse.   Your e-visit answers were reviewed by a board certified advanced clinical practitioner to complete your personal care plan, Depending upon the condition, your plan could have included both over the counter or prescription medications.  Please review your pharmacy choice. Your safety is important to us. If you have drug allergies check your prescription carefully. You can use MyChart to ask questions about today's visit, request a non-urgent call back, or ask for a work or school excuse for 24 hours related to this e-Visit. If it has been greater than 24 hours you will need to follow up with your provider, or enter a new e-Visit to address those concerns.  You will get  an e-mail in the next two days asking about your experience. I hope that your e-visit has been valuable and will speed your recovery. Thank you for using e-visits.

## 2018-06-14 DIAGNOSIS — H903 Sensorineural hearing loss, bilateral: Secondary | ICD-10-CM | POA: Insufficient documentation

## 2018-09-05 ENCOUNTER — Emergency Department (HOSPITAL_COMMUNITY)
Admission: EM | Admit: 2018-09-05 | Discharge: 2018-09-05 | Disposition: A | Discharge status: Home / Self Care | Payer: Commercial Managed Care - PPO | Attending: Emergency Medicine | Admitting: Emergency Medicine

## 2018-09-05 ENCOUNTER — Other Ambulatory Visit: Payer: Self-pay

## 2018-09-05 ENCOUNTER — Emergency Department (HOSPITAL_COMMUNITY): Payer: Commercial Managed Care - PPO

## 2018-09-05 ENCOUNTER — Encounter (HOSPITAL_COMMUNITY): Payer: Self-pay | Admitting: Emergency Medicine

## 2018-09-05 DIAGNOSIS — Z79899 Other long term (current) drug therapy: Secondary | ICD-10-CM | POA: Insufficient documentation

## 2018-09-05 DIAGNOSIS — R1011 Right upper quadrant pain: Secondary | ICD-10-CM | POA: Insufficient documentation

## 2018-09-05 DIAGNOSIS — J45909 Unspecified asthma, uncomplicated: Secondary | ICD-10-CM | POA: Insufficient documentation

## 2018-09-05 LAB — COMPREHENSIVE METABOLIC PANEL
ALT: 41 U/L (ref 0–44)
AST: 35 U/L (ref 15–41)
Albumin: 4 g/dL (ref 3.5–5.0)
Alkaline Phosphatase: 59 U/L (ref 38–126)
Anion gap: 10 (ref 5–15)
BUN: 15 mg/dL (ref 6–20)
CO2: 26 mmol/L (ref 22–32)
Calcium: 9.1 mg/dL (ref 8.9–10.3)
Chloride: 102 mmol/L (ref 98–111)
Creatinine, Ser: 1.01 mg/dL (ref 0.61–1.24)
GFR calc Af Amer: >60 mL/min (ref >60)
GFR calc non Af Amer: >60 mL/min (ref >60)
Glucose, Bld: 141 mg/dL — ABNORMAL HIGH (ref 70–99)
Potassium: 4.5 mmol/L (ref 3.5–5.1)
Sodium: 138 mmol/L (ref 135–145)
Total Bilirubin: 0.5 mg/dL (ref 0.3–1.2)
Total Protein: 7.5 g/dL (ref 6.5–8.1)

## 2018-09-05 LAB — CBC
HCT: 46.5 % (ref 39.0–52.0)
Hemoglobin: 16.3 g/dL (ref 13.0–17.0)
MCH: 31.4 pg (ref 26.0–34.0)
MCHC: 35.1 g/dL (ref 30.0–36.0)
MCV: 89.6 fL (ref 80.0–100.0)
Platelets: 202 10*3/uL (ref 150–400)
RBC: 5.19 MIL/uL (ref 4.22–5.81)
RDW: 12.3 % (ref 11.5–15.5)
WBC: 8.7 10*3/uL (ref 4.0–10.5)
nRBC: 0 % (ref 0.0–0.2)

## 2018-09-05 LAB — URINALYSIS, ROUTINE W REFLEX MICROSCOPIC
Bilirubin Urine: NEGATIVE
Glucose, UA: NEGATIVE mg/dL
Hgb urine dipstick: NEGATIVE
Ketones, ur: NEGATIVE mg/dL
Leukocytes,Ua: NEGATIVE
Nitrite: NEGATIVE
Protein, ur: NEGATIVE mg/dL
Specific Gravity, Urine: 1.017 (ref 1.005–1.030)
pH: 6 (ref 5.0–8.0)

## 2018-09-05 LAB — LIPASE, BLOOD: Lipase: 27 U/L (ref 11–51)

## 2018-09-05 MED ORDER — SODIUM CHLORIDE 0.9% FLUSH: 3.0000 mL | Freq: Once | INTRAVENOUS | Status: DC

## 2018-09-05 NOTE — Discharge Instructions (Signed)
There are many causes of abdominal pain. Most pain is not serious and goes away, but some pain gets worse, changes, or will not go away. Please return to the emergency department or see your doctor right away if you (or your family member) experience any of the following:   Pain that gets worse or moves to just one spot.  Pain that gets worse if you cough or sneeze.  Pain with going over a bump in the road.  Pain that does not get better in 24 hours.  Inability to keep down liquids (vomiting)--especially if you are making less urine.  Fainting.  Blood in the vomit or stool.  High fever or shaking chills.  Swelling of the abdomen.  Any new or worsening problem.   Your gall bladder today looked normal on the ultrasound.  Follow-up Instructions  See your primary care provider in 2-5 days for further evaluation and follow up. Medications  Continue usual home medications   Additional Instructions  No alcohol.  No caffeine, aspirin, or cigarettes.

## 2018-09-05 NOTE — ED Notes (Signed)
Introduced self to pt. Pt states last emesis on Friday.

## 2018-09-05 NOTE — ED Notes (Signed)
Pt aware UA needed. Urinal at bedside. Pt instructed on usage of call light

## 2018-09-05 NOTE — ED Notes (Signed)
Pt provided water, saltines, graham crackers, and peanut butter.

## 2018-09-05 NOTE — ED Provider Notes (Signed)
[facility] DEPT Provider Note   CSN: [id number] Arrival date & time: 09/05/18  0507     History   Chief Complaint Chief Complaint  Patient presents with  . Abdominal Pain    HPI Corey Soto is a 53 y.o. male with past medical history of asthma, hyperlipidemia presents emergency department today with chief complaint of right upper quadrant abdominal pain.  This is been going on x2 days.  Patient states pain does not radiate.  He states pain alternates between sharp and cramping. He thinks the pain started after eating lunch yesterday. At onset pain was 7/10 in severity. He took prednisone and aspirin prior to arrival and states pain has improved to 3-10 in severity. It has started to improve before he took the medications so he is unsure if they helped. He denies any fever, chills, chest pain, nausea, emesis, urinary symptoms, diarrhea. He denies history of similar pain, alcohol consumption, and abdominal surgical history.  History provided by patient with additional history obtained from chart review.       Past Medical History:  Diagnosis Date  . Allergic rhinitis   . Asthma   . Hyperlipidemia    LDL 182.8    Patient Active Problem List   Diagnosis Date Noted  . Fever 02/12/2014  . Acute pharyngitis 02/12/2014  . Influenza 02/12/2014  . Otitis media 02/12/2014  . Nonspecific elevation of levels of transaminase or lactic acid dehydrogenase (LDH) 10/05/2012  . Nonspecific abnormal electrocardiogram (ECG) (EKG) 10/05/2012  . Mixed hyperlipidemia 03/09/2010  . Seasonal and perennial allergic rhinitis 03/25/2009  . Allergic-infective asthma 03/25/2009  . DIPLOPIA, HX OF 03/25/2009    Past Surgical History:  Procedure Laterality Date  . WISDOM TOOTH EXTRACTION          Home Medications    Prior to Admission medications   Medication Sig Start Date End Date Taking? Authorizing Provider  albuterol (VENTOLIN HFA) 108 (90 Base) MCG/ACT  inhaler Inhale 2 puffs into the lungs 4 (four) times daily as needed for wheezing. 04/20/18   Sharion Balloon, FNP  ibuprofen (ADVIL,MOTRIN) 800 MG tablet Take 1 tablet (800 mg total) by mouth every 8 (eight) hours as needed for moderate pain. 08/15/17   Raylene Everts, MD  Multiple Vitamin (MULTIVITAMIN) tablet Take 1 tablet by mouth daily.      [provider]    Family History Family History  Problem Relation Age of Onset  . Lung cancer Maternal Grandmother        smoker  . Stroke Daughter        in teens (BCP & smoking)  . Heart attack Maternal Grandfather        > 55  . Healthy Mother   . Heart disease Father   . Diabetes Neg Hx     Social History Social History   Tobacco Use  . Smoking status: Never Smoker  . Smokeless tobacco: Never Used  Substance Use Topics  . Alcohol use: No  . Drug use: Not on file     Allergies   Patient has no known allergies.   Review of Systems Review of Systems  Constitutional: Negative for chills and fever.  HENT: Negative for congestion, rhinorrhea, sinus pressure and sore throat.   Eyes: Negative for pain and redness.  Respiratory: Negative for cough, shortness of breath and wheezing.   Cardiovascular: Negative for chest pain and palpitations.  Gastrointestinal: Positive for abdominal pain. Negative for constipation, diarrhea, nausea and vomiting.  Genitourinary: Negative for dysuria and hematuria.  Musculoskeletal: Negative for arthralgias, back pain, myalgias and neck pain.  Skin: Negative for rash and wound.  Neurological: Negative for dizziness, syncope, weakness, numbness and headaches.  Psychiatric/Behavioral: Negative for confusion.     Physical Exam Updated Vital Signs BP 131/84   Pulse 67   Temp 99.3 F (37.4 C) (Oral)   Resp 16   Ht 5\' 5"  (1.651 m)   Wt 99.8 kg   SpO2 95%   BMI 36.61 kg/m   Physical Exam Vitals signs and nursing note reviewed.  Constitutional:      General: He is not in acute  distress.    Appearance: He is not ill-appearing.  HENT:     Head: Normocephalic and atraumatic.     Right Ear: Tympanic membrane and external ear normal.     Left Ear: Tympanic membrane and external ear normal.     Nose: Nose normal.     Mouth/Throat:     Mouth: Mucous membranes are moist.     Pharynx: Oropharynx is clear.  Eyes:     General: No scleral icterus.       Right eye: No discharge.        Left eye: No discharge.     Extraocular Movements: Extraocular movements intact.     Conjunctiva/sclera: Conjunctivae normal.     Pupils: Pupils are equal, round, and reactive to light.  Neck:     Musculoskeletal: Normal range of motion.     Vascular: No JVD.  Cardiovascular:     Rate and Rhythm: Normal rate and regular rhythm.     Pulses: Normal pulses.          Radial pulses are 2+ on the right side and 2+ on the left side.     Heart sounds: Normal heart sounds.  Pulmonary:     Comments: Lungs clear to auscultation in all fields. Symmetric chest rise. No wheezing, rales, or rhonchi. Abdominal:     General: Bowel sounds are normal.     Comments: Abdomen is soft, non-distended, RUQ with mild tenderness. No rigidity, no guarding. No peritoneal signs.  Musculoskeletal: Normal range of motion.  Skin:    General: Skin is warm and dry.     Capillary Refill: Capillary refill takes less than 2 seconds.  Neurological:     Mental Status: He is oriented to person, place, and time.     GCS: GCS eye subscore is 4. GCS verbal subscore is 5. GCS motor subscore is 6.     Comments: Fluent speech, no facial droop.  Psychiatric:        Behavior: Behavior normal.      ED Treatments / Results  Labs (all labs ordered are listed, but only abnormal results are displayed) Labs Reviewed  COMPREHENSIVE METABOLIC PANEL - Abnormal; Notable for the following components:      Result Value   Glucose, Bld 141 (*)    All other components within normal limits  LIPASE, BLOOD  CBC  URINALYSIS, ROUTINE  W REFLEX MICROSCOPIC    EKG None  Radiology [place] Abdomen Limited Ruq  Result Date: 09/05/2018 CLINICAL DATA:  Right upper quadrant pain. EXAM: ULTRASOUND ABDOMEN LIMITED RIGHT UPPER QUADRANT COMPARISON:  No prior. FINDINGS: Gallbladder: No gallstones or wall thickening visualized. No sonographic Murphy sign noted by sonographer. Common bile duct: Diameter: 3.0 mm Liver: No focal lesion identified. Mild increased echogenicity cannot be excluded. Portal vein is patent on color Doppler imaging with normal direction of  blood flow towards the liver. Other: Exam limited due to overlying bowel gas. IMPRESSION: 1. Exam limited due to overlying bowel gas. No acute abnormality identified. No evidence of gallstones or biliary distention. 2. Mild increased echogenicity of the liver cannot be excluded. Mild fatty infiltration cannot be excluded. Electronically Signed   By: Maisie Fushomas  Register   On: 09/05/2018 09:38    Procedures Procedures (including critical care time)  Medications Ordered in ED Medications  sodium chloride flush (NS) 0.9 % injection 3 mL (0 mLs Intravenous Hold 09/05/18 0733)     Initial Impression / Assessment and Plan / ED Course  I have reviewed the triage vital signs and the nursing notes.  Pertinent labs & imaging results that were available during my care of the patient were reviewed by me and considered in my medical decision making (see chart for details).  Patient presents to the ED with complaints of abdominal pain. Patient nontoxic appearing, in no apparent distress, vitals WNL. On exam patient tender to right upper quadrant, no peritoneal signs. Will evaluate with labs and abdominal US. Pt denies analgesics and anti-emetics. Labs reviewed and grossly unremarkable. No leukocytosis, no anemia, no significant electrolyte derangements. LFTs, renal function, and lipase WNL. Urinalysis without obvious infection.  Abdominal US is without signs of cholecystitis or gall stones no  biliary distention, gall bladder unremarkable. Possible fatty liver.  On repeat abdominal exam patient remains without peritoneal signs, doubt cholecystitis, pancreatitis, diverticulitis, appendicitis, bowel obstruction/perforation. Patient tolerating PO in the emergency department. Will discharge home with supportive measures. I discussed results, treatment plan, need for PCP follow-up, and return precautions with the patient. Provided opportunity for questions, patient confirmed understanding and is in agreement with plan.   This note was prepared using Dragon voice recognition software and may include unintentional dictation errors due to the inherent limitations of voice recognition software.     Final Clinical Impressions(s) / ED Diagnoses   Final diagnoses:  RUQ abdominal pain    ED Discharge Orders    None       Kathyrn Lasslbrizze, Gresham Caetano E, PA-C 09/05/18 1515    Raeford RazorKohut, Stephen, MD 09/08/18 1138

## 2018-09-05 NOTE — ED Triage Notes (Signed)
Pt reports having right upper abdominal pain along with nausea and vomiting that started yesterday.

## 2020-02-19 DIAGNOSIS — R29700 NIHSS score 0: Secondary | ICD-10-CM | POA: Diagnosis not present

## 2020-02-19 DIAGNOSIS — E785 Hyperlipidemia, unspecified: Secondary | ICD-10-CM | POA: Diagnosis not present

## 2020-02-19 DIAGNOSIS — I10 Essential (primary) hypertension: Secondary | ICD-10-CM | POA: Diagnosis not present

## 2020-02-19 DIAGNOSIS — E119 Type 2 diabetes mellitus without complications: Secondary | ICD-10-CM | POA: Diagnosis not present

## 2020-02-19 DIAGNOSIS — Z23 Encounter for immunization: Secondary | ICD-10-CM | POA: Diagnosis not present

## 2020-02-19 DIAGNOSIS — R42 Dizziness and giddiness: Secondary | ICD-10-CM | POA: Diagnosis not present

## 2020-02-19 DIAGNOSIS — I4891 Unspecified atrial fibrillation: Secondary | ICD-10-CM | POA: Diagnosis not present

## 2020-02-19 DIAGNOSIS — I639 Cerebral infarction, unspecified: Secondary | ICD-10-CM | POA: Diagnosis not present

## 2020-02-20 DIAGNOSIS — I639 Cerebral infarction, unspecified: Secondary | ICD-10-CM | POA: Diagnosis not present

## 2020-02-26 DIAGNOSIS — Z9289 Personal history of other medical treatment: Secondary | ICD-10-CM | POA: Diagnosis not present

## 2020-02-26 DIAGNOSIS — E119 Type 2 diabetes mellitus without complications: Secondary | ICD-10-CM | POA: Diagnosis not present

## 2020-02-26 DIAGNOSIS — Z8673 Personal history of transient ischemic attack (TIA), and cerebral infarction without residual deficits: Secondary | ICD-10-CM | POA: Diagnosis not present

## 2020-02-27 DIAGNOSIS — R4701 Aphasia: Secondary | ICD-10-CM | POA: Diagnosis not present

## 2020-02-27 DIAGNOSIS — I639 Cerebral infarction, unspecified: Secondary | ICD-10-CM | POA: Diagnosis not present

## 2020-03-11 ENCOUNTER — Ambulatory Visit: Payer: BC Managed Care – PPO | Attending: Family Medicine | Admitting: Occupational Therapy

## 2020-03-11 ENCOUNTER — Other Ambulatory Visit: Payer: Self-pay

## 2020-03-11 DIAGNOSIS — R41842 Visuospatial deficit: Secondary | ICD-10-CM | POA: Insufficient documentation

## 2020-03-11 DIAGNOSIS — R278 Other lack of coordination: Secondary | ICD-10-CM | POA: Diagnosis not present

## 2020-03-11 DIAGNOSIS — M6281 Muscle weakness (generalized): Secondary | ICD-10-CM

## 2020-03-11 DIAGNOSIS — I69318 Other symptoms and signs involving cognitive functions following cerebral infarction: Secondary | ICD-10-CM | POA: Insufficient documentation

## 2020-03-11 NOTE — Therapy (Signed)
[facility] [address] [place], [place], [address] Phone: [phone]   Fax:  [phone]  Occupational Therapy Evaluation  Patient Details  Name: Corey Soto MRN: [id number] Date of Birth: April 09, 1965 Referring Provider (OT): Dr. Aliene Beams   Encounter Date: 03/11/2020   OT End of Session - 03/11/20 1032    Visit Number 1    Number of Visits 9    Date for OT Re-Evaluation 04/08/20    Authorization Type BC/BS - VL 100 - FOTO at d/c    OT Start Time 0800    OT Stop Time 0845    OT Time Calculation (min) 45 min    Activity Tolerance Patient tolerated treatment well    Behavior During Therapy [facility] for tasks assessed/performed           Past Medical History:  Diagnosis Date  . Allergic rhinitis   . Asthma   . Hyperlipidemia    LDL 182.8    Past Surgical History:  Procedure Laterality Date  . WISDOM TOOTH EXTRACTION      There were no vitals filed for this visit.   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 PMH: HLD, HTN, asthma    Limitations no driving (diplopia)    Patient Stated Goals to improve my writing    Currently in Pain? No/denies             [facility] OT Assessment - 03/11/20 0001      Assessment   Medical Diagnosis CVA    Referring Provider (OT) Dr. Aliene Beams    Onset Date/Surgical Date 02/19/20    Hand Dominance Right    Prior Therapy none      Precautions   Precautions Other (comment)    Precaution Comments no driving      Balance Screen   Has the patient fallen in the past 6 months No      Home  Environment   Bathroom Copywriter, advertising    Additional Comments Pt lives in 1 story home with 2 steps to enter    Lives With Spouse      Prior Function   Level of Independence Independent    Vocation Full time employment    Print production planner work -  remote now, was going in 3 days/week in [place]      ADL   Eating/Feeding Independent    Grooming Modified independent   not as precise w/ Rt hand   Upper Body Bathing Independent    Lower Body Bathing Independent    Upper Body Dressing Independent    Lower Body Dressing Independent   slip on shoes   Lobbyist - Solicitor -  Database administrator Independent      IADL   Shopping Needs to be accompanied on any shopping trip    Light Housekeeping Performs light daily tasks such as dishwashing, bed making    Meal Prep Plans, prepares and serves adequate meals independently    Community Mobility Relies on family or friends for transportation   currently   Medication Management Takes responsibility if medication is prepared in advance in seperate dosage   uses Financial trader financial matters independently (budgets, writes checks, pays rent, bills goes to bank), collects and keeps track  of income      Mobility   Mobility Status Independent      Written Expression   Dominant Hand Right    Handwriting 100% legible   Pt reports slower for legibility     Vision - History   Baseline Vision Wears glasses all the time      Vision Assessment   Diplopia Assessment --   diplopia in all quadrants but improves w/ convergence and far Rt/Lt     Cognition   Overall Cognitive Status Impaired/Different from baseline    Cognition Comments mild aphasia reported (using the wrong word in a sentence)      Observation/Other Assessments   Observations HOH      Sensation   Light Touch Appears Intact      Coordination   9 Hole Peg Test Right;Left    Right 9 Hole Peg Test 23.62 sec    Left 9 Hole Peg Test 21.35 sec    Coordination Pt reports typing is slower too (hunt and peck method)      Edema   Edema none      ROM / Strength   AROM / PROM / Strength AROM;Strength      AROM    Overall AROM Comments BUE AROM WFL's      Strength   Overall Strength Comments BUE MMT grossly 5/5      Hand Function   Right Hand Grip (lbs) 82 lbs    Left Hand Grip (lbs) 90.6 lbs                                OT Long Term Goals - 03/11/20 1038      OT LONG TERM GOAL #1   Title Independent with HEP for coordination (including typing), grip strength, and diplopia    Time 4    Period Weeks    Status New      OT LONG TERM GOAL #2   Title Improve coordination as evidenced by reducing speed on 9 hole peg test to 21 sec or less Rt hand    Baseline 23.62 sec    Time 4    Period Weeks    Status New      OT LONG TERM GOAL #3   Title Improve grip strength by 5 lbs or more Rt hand    Baseline Rt = 82 lbs (Lt = 90 lbs)    Time 4    Period Weeks    Status New      OT LONG TERM GOAL #4   Title Practice writing 3 sentences or more in reasonable amount of time maintaining legibility    Time 4    Period Weeks    Status New      OT LONG TERM GOAL #5   Title Pt to report improved diplopia in primary gaze using A/E prn    Time 4    Period Weeks    Status New      Long Term Additional Goals   Additional Long Term Goals Yes      OT LONG TERM GOAL #6   Title Pt to perform environmental scanning with physical task with 90% or greater accuracy in prep for return to driving    Time 4    Period Weeks    Status New                 Plan - 03/11/20 1034  Clinical Impression Statement Pt is a 55 y.o. male who presents to OPOT s/p Lt CVA on 02/19/20 with mild Rt hemiparesis. Pt presents today with decreased coordination, decreased strength, mild aphasia and possible cognitive deficits, and diplopia. Pt would benefit from O.T. to address these deficits and return pt to PLOF    OT Occupational Profile and History Problem Focused Assessment - Including review of records relating to presenting problem    Occupational performance deficits (Please refer to  evaluation for details): IADL's;Work;Leisure    Body Structure / Function / Physical Skills Strength;Dexterity;GMC;UE functional use;Endurance;IADL;Vision;Coordination;FMC    Cognitive Skills Perception;Problem Solve;Attention    Clinical Decision Making Several treatment options, min-mod task modification necessary    Comorbidities Affecting Occupational Performance: May have comorbidities impacting occupational performance    Modification or Assistance to Complete Evaluation  No modification of tasks or assist necessary to complete eval    OT Frequency 2x / week    OT Duration 4 weeks   or 1x/wk for 8 weeks (depending on schedule and availability)   OT Treatment/Interventions Self-care/ADL training;DME and/or AE instruction;Fluidtherapy;Therapeutic activities;Therapeutic exercise;Cognitive remediation/compensation;Coping strategies training;Visual/perceptual remediation/compensation;Neuromuscular education;Manual Therapy;Patient/family education    Plan coordination and putty HEP, if time - practice taping glasses for diplopia    Consulted and Agree with Plan of Care Patient           Patient will benefit from skilled therapeutic intervention in order to improve the following deficits and impairments:   Body Structure / Function / Physical Skills: Strength,Dexterity,GMC,UE functional use,Endurance,IADL,Vision,Coordination,FMC Cognitive Skills: Perception,Problem Solve,Attention     Visit Diagnosis: Other lack of coordination  Other symptoms and signs involving cognitive functions following cerebral infarction  Visuospatial deficit  Muscle weakness (generalized)    Problem List Patient Active Problem List   Diagnosis Date Noted  . Fever 02/12/2014  . Acute pharyngitis 02/12/2014  . Influenza 02/12/2014  . Otitis media 02/12/2014  . Nonspecific elevation of levels of transaminase or lactic acid dehydrogenase (LDH) 10/05/2012  . Nonspecific abnormal electrocardiogram (ECG)  (EKG) 10/05/2012  . Mixed hyperlipidemia 03/09/2010  . Seasonal and perennial allergic rhinitis 03/25/2009  . Allergic-infective asthma 03/25/2009  . DIPLOPIA, HX OF 03/25/2009    Kelli Churn, OTR/L 03/11/2020, 10:44 AM  [facility] [address] [place], [place], [address] Phone: [phone]   Fax:  [phone]  Name: Corey Soto MRN: [id number] Date of Birth: November 04, 1965

## 2020-03-12 DIAGNOSIS — E782 Mixed hyperlipidemia: Secondary | ICD-10-CM | POA: Diagnosis not present

## 2020-03-12 DIAGNOSIS — Z8673 Personal history of transient ischemic attack (TIA), and cerebral infarction without residual deficits: Secondary | ICD-10-CM | POA: Diagnosis not present

## 2020-03-12 DIAGNOSIS — E118 Type 2 diabetes mellitus with unspecified complications: Secondary | ICD-10-CM | POA: Insufficient documentation

## 2020-03-12 DIAGNOSIS — I4891 Unspecified atrial fibrillation: Secondary | ICD-10-CM | POA: Diagnosis not present

## 2020-03-12 DIAGNOSIS — E119 Type 2 diabetes mellitus without complications: Secondary | ICD-10-CM | POA: Diagnosis not present

## 2020-03-17 DIAGNOSIS — I471 Supraventricular tachycardia: Secondary | ICD-10-CM | POA: Diagnosis not present

## 2020-03-18 ENCOUNTER — Encounter: Payer: Self-pay | Admitting: Occupational Therapy

## 2020-03-18 ENCOUNTER — Ambulatory Visit: Payer: BC Managed Care – PPO | Attending: Family Medicine | Admitting: Occupational Therapy

## 2020-03-18 ENCOUNTER — Other Ambulatory Visit: Payer: Self-pay

## 2020-03-18 DIAGNOSIS — M6281 Muscle weakness (generalized): Secondary | ICD-10-CM | POA: Insufficient documentation

## 2020-03-18 DIAGNOSIS — I69318 Other symptoms and signs involving cognitive functions following cerebral infarction: Secondary | ICD-10-CM | POA: Insufficient documentation

## 2020-03-18 DIAGNOSIS — R278 Other lack of coordination: Secondary | ICD-10-CM | POA: Insufficient documentation

## 2020-03-18 DIAGNOSIS — R41842 Visuospatial deficit: Secondary | ICD-10-CM | POA: Diagnosis not present

## 2020-03-18 NOTE — Therapy (Signed)
[facility] [address] [place], [place], [address] Phone: [phone]   Fax:  [phone]  Occupational Therapy Treatment  Patient Details  Name: Corey Soto MRN: [id number] Date of Birth: 10/30/1965 Referring Provider (OT): Dr. Caren Macadam   Encounter Date: 03/18/2020   OT End of Session - 03/18/20 1704    Visit Number 2    Number of Visits 9    Date for OT Re-Evaluation 04/08/20    Authorization Type BC/BS - VL 100 - FOTO at d/c    OT Start Time 1703    OT Stop Time 1742    OT Time Calculation (min) 39 min    Activity Tolerance Patient tolerated treatment well    Behavior During Therapy [facility] for tasks assessed/performed           Past Medical History:  Diagnosis Date  . Allergic rhinitis   . Asthma   . Hyperlipidemia    LDL 182.8    Past Surgical History:  Procedure Laterality Date  . WISDOM TOOTH EXTRACTION      There were no vitals filed for this visit.   Subjective Assessment - 03/18/20 1706    Subjective  Pt denies any pain. Pt reports diplopia has resolved as far as he can tell.    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 PMH: HLD, HTN, asthma    Limitations no driving (diplopia)    Patient Stated Goals to improve my writing    Currently in Pain? No/denies                        OT Treatments/Exercises (OP) - 03/18/20 1719      Exercises   Exercises Hand      Cognitive Exercises   Keyboarding typing test on typing master with 14 wpm with 78% accuracy with 11wpm net speed. WordTris easy words with hunt and peck method for typing.      Fine Motor Coordination (Hand/Wrist)   Fine Motor Coordination Handwriting;Grooved pegs;In hand manipuation training    In Hand Manipulation Training golf balls in right UE    Handwriting practiced handwriting and cooridnation for handwriting. copying loops, zigzags and plateus  with increased difficulty with zigs and plateus. pt reports handwriting is not like it used to look but was legible this day.    Grooved pegs pt placed pegs with RUE and with min difficulty and min drops. Pt helpd paperclip in palm of his hand while placing to encourage tip pinch                  OT Education - 03/18/20 1708    Education Details Reviewed goals and POC. Issued HEP for coordination    Person(s) Educated Patient    Methods Explanation;Demonstration;Handout    Comprehension Verbalized understanding;Returned demonstration               OT Long Term Goals - 03/18/20 1704      OT LONG TERM GOAL #1   Title Independent with HEP for coordination (including typing), grip strength, and diplopia    Time 4    Period Weeks    Status On-going   diplopia resolved per pt report. pt also met grip strength goal 03/18/20     OT LONG TERM GOAL #2   Title Improve coordination as evidenced by reducing speed on 9 hole peg test to 21 sec or  less Rt hand    Baseline 23.62 sec    Time 4    Period Weeks    Status On-going   23.34s with RUE on 03/18/20     OT LONG TERM GOAL #3   Title Improve grip strength by 5 lbs or more Rt hand    Baseline Rt = 82 lbs (Lt = 90 lbs)    Time 4    Period Weeks    Status Achieved   97 lbs with RUE on 03/18/20     OT LONG TERM GOAL #4   Title Practice writing 3 sentences or more in reasonable amount of time maintaining legibility    Time 4    Period Weeks    Status New      OT LONG TERM GOAL #5   Title Pt to report improved diplopia in primary gaze using A/E prn    Time 4    Period Weeks    Status Achieved      OT LONG TERM GOAL #6   Title Pt to perform environmental scanning with physical task with 90% or greater accuracy in prep for return to driving    Time 4    Period Weeks    Status New                 Plan - 03/18/20 1710    Clinical Impression Statement Pt has progressed since evaluation. Pt reports diplopia resolved as  far as he can tell. Pt presents with difficulty with typing speed and accuracy this day.    OT Occupational Profile and History Problem Focused Assessment - Including review of records relating to presenting problem    Occupational performance deficits (Please refer to evaluation for details): IADL's;Work;Leisure    Body Structure / Function / Physical Skills Strength;Dexterity;GMC;UE functional use;Endurance;IADL;Vision;Coordination;FMC    Cognitive Skills Perception;Problem Solve;Attention    Clinical Decision Making Several treatment options, min-mod task modification necessary    Comorbidities Affecting Occupational Performance: May have comorbidities impacting occupational performance    Modification or Assistance to Complete Evaluation  No modification of tasks or assist necessary to complete eval    OT Frequency 2x / week    OT Duration 4 weeks   or 1x/wk for 8 weeks (depending on schedule and availability)   OT Treatment/Interventions Self-care/ADL training;DME and/or AE instruction;Fluidtherapy;Therapeutic activities;Therapeutic exercise;Cognitive remediation/compensation;Coping strategies training;Visual/perceptual remediation/compensation;Neuromuscular education;Manual Therapy;Patient/family education    Plan right handed typing, handwriting    Consulted and Agree with Plan of Care Patient           Patient will benefit from skilled therapeutic intervention in order to improve the following deficits and impairments:   Body Structure / Function / Physical Skills: Strength,Dexterity,GMC,UE functional use,Endurance,IADL,Vision,Coordination,FMC Cognitive Skills: Perception,Problem Solve,Attention     Visit Diagnosis: Other lack of coordination  Other symptoms and signs involving cognitive functions following cerebral infarction  Visuospatial deficit  Muscle weakness (generalized)    Problem List Patient Active Problem List   Diagnosis Date Noted  . Fever 02/12/2014  .  Acute pharyngitis 02/12/2014  . Influenza 02/12/2014  . Otitis media 02/12/2014  . Nonspecific elevation of levels of transaminase or lactic acid dehydrogenase (LDH) 10/05/2012  . Nonspecific abnormal electrocardiogram (ECG) (EKG) 10/05/2012  . Mixed hyperlipidemia 03/09/2010  . Seasonal and perennial allergic rhinitis 03/25/2009  . Allergic-infective asthma 03/25/2009  . DIPLOPIA, HX OF 03/25/2009    Zachery Conch MOT, OTR/L  03/18/2020, 5:44 PM  [facility] [address]  [place], [place], [address] Phone: [phone]   Fax:  [phone]  Name: Corey Soto MRN: [id number] Date of Birth: 15-Jan-1965

## 2020-03-18 NOTE — Patient Instructions (Addendum)
  Coordination Activities  Perform the following activities for 20 minutes 2 times per day with right hand(s).   Rotate ball in fingertips (clockwise and counter-clockwise).  Toss ball between hands.  Toss ball in air and catch with the same hand.  Juggle 2 balls.  Flip cards 1 at a time as fast as you can.  Deal cards with your thumb (Hold deck in hand and push card off top with thumb).  Rotate card in hand (clockwise and counter-clockwise).  Shuffle cards.  Pick up coins, buttons, marbles, dried beans/pasta of different sizes and place in container.  Pick up coins and place in container or coin bank.  Pick up coins and stack.  Pick up coins one at a time until you get 5-10 in your hand, then move coins from palm to fingertips to stack one at a time.  Twirl pen between fingers.  Practice writing and/or typing.  Screw together nuts and bolts, then unfasten.

## 2020-03-20 ENCOUNTER — Other Ambulatory Visit: Payer: Self-pay

## 2020-03-20 ENCOUNTER — Encounter: Payer: Self-pay | Admitting: Occupational Therapy

## 2020-03-20 ENCOUNTER — Ambulatory Visit: Payer: BC Managed Care – PPO | Admitting: Occupational Therapy

## 2020-03-20 DIAGNOSIS — R41842 Visuospatial deficit: Secondary | ICD-10-CM | POA: Diagnosis not present

## 2020-03-20 DIAGNOSIS — I69318 Other symptoms and signs involving cognitive functions following cerebral infarction: Secondary | ICD-10-CM

## 2020-03-20 DIAGNOSIS — M6281 Muscle weakness (generalized): Secondary | ICD-10-CM | POA: Diagnosis not present

## 2020-03-20 DIAGNOSIS — R278 Other lack of coordination: Secondary | ICD-10-CM | POA: Diagnosis not present

## 2020-03-20 NOTE — Therapy (Signed)
[facility] [address] [place], [place], [address] Phone: [phone]   Fax:  [phone]  Occupational Therapy Treatment  Patient Details  Name: Corey Soto MRN: [id number] Date of Birth: October 30, 1965 Referring Provider (OT): Dr. Caren Macadam   Encounter Date: 03/20/2020   OT End of Session - 03/20/20 1743    Visit Number 3    Number of Visits 9    Date for OT Re-Evaluation 04/08/20    Authorization Type BC/BS - VL 100 - FOTO at d/c    OT Start Time 1742    OT Stop Time 1820    OT Time Calculation (min) 38 min    Activity Tolerance Patient tolerated treatment well    Behavior During Therapy [facility] for tasks assessed/performed           Past Medical History:  Diagnosis Date  . Allergic rhinitis   . Asthma   . Hyperlipidemia    LDL 182.8    Past Surgical History:  Procedure Laterality Date  . WISDOM TOOTH EXTRACTION      There were no vitals filed for this visit.   Subjective Assessment - 03/20/20 1743    Subjective  Pt denies any pain.    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 PMH: HLD, HTN, asthma    Limitations no driving (diplopia)    Patient Stated Goals to improve my writing    Currently in Pain? No/denies                        OT Treatments/Exercises (OP) - 03/20/20 1745      Fine Motor Coordination (Hand/Wrist)   Fine Motor Coordination O'Connor pegs    In Hand Manipulation Training typing on typing master with word tris and clouds with moderate difficulty    Handwriting writing 3 sentences with increased time and approx 90% legibility. worked on Camera operator with worksheet with touching of lines but not deviating from path.    O'Connor pegs completed with RUE with placing with hands and then removing with tweezers. Pt then placed into board with tweezers and removed with min difficulty.                        OT Long Term Goals - 03/20/20 1744      OT LONG TERM GOAL #1   Title Independent with HEP for coordination (including typing), grip strength, and diplopia    Time 4    Period Weeks    Status Achieved   diplopia resolved per pt report. pt also met grip strength goal 03/18/20     OT LONG TERM GOAL #2   Title Improve coordination as evidenced by reducing speed on 9 hole peg test to 21 sec or less Rt hand    Baseline 23.62 sec    Time 4    Period Weeks    Status On-going   23.34s with RUE on 03/18/20     OT LONG TERM GOAL #3   Title Improve grip strength by 5 lbs or more Rt hand    Baseline Rt = 82 lbs (Lt = 90 lbs)    Time 4    Period Weeks    Status Achieved   97 lbs with RUE on 03/18/20     OT LONG TERM GOAL #4   Title Practice writing 3 sentences or more  in reasonable amount of time maintaining legibility    Time 4    Period Weeks    Status On-going      OT LONG TERM GOAL #5   Title Pt to report improved diplopia in primary gaze using A/E prn    Time 4    Period Weeks    Status Achieved      OT LONG TERM GOAL #6   Title Pt to perform environmental scanning with physical task with 90% or greater accuracy in prep for return to driving    Time 4    Period Weeks    Status Achieved   100% accuracy 03/20/2020                Plan - 03/20/20 1812    Clinical Impression Statement Pt has met 4/6 LTGs.    OT Occupational Profile and History Problem Focused Assessment - Including review of records relating to presenting problem    Occupational performance deficits (Please refer to evaluation for details): IADL's;Work;Leisure    Body Structure / Function / Physical Skills Strength;Dexterity;GMC;UE functional use;Endurance;IADL;Vision;Coordination;FMC    Cognitive Skills Perception;Problem Solve;Attention    Clinical Decision Making Several treatment options, min-mod task modification necessary    Comorbidities Affecting Occupational Performance: May  have comorbidities impacting occupational performance    Modification or Assistance to Complete Evaluation  No modification of tasks or assist necessary to complete eval    OT Frequency 2x / week    OT Duration 4 weeks   or 1x/wk for 8 weeks (depending on schedule and availability)   OT Treatment/Interventions Self-care/ADL training;DME and/or AE instruction;Fluidtherapy;Therapeutic activities;Therapeutic exercise;Cognitive remediation/compensation;Coping strategies training;Visual/perceptual remediation/compensation;Neuromuscular education;Manual Therapy;Patient/family education    Plan right handed typing, handwriting    Consulted and Agree with Plan of Care Patient           Patient will benefit from skilled therapeutic intervention in order to improve the following deficits and impairments:   Body Structure / Function / Physical Skills: Strength,Dexterity,GMC,UE functional use,Endurance,IADL,Vision,Coordination,FMC Cognitive Skills: Perception,Problem Solve,Attention     Visit Diagnosis: Other lack of coordination  Other symptoms and signs involving cognitive functions following cerebral infarction  Visuospatial deficit  Muscle weakness (generalized)    Problem List Patient Active Problem List   Diagnosis Date Noted  . Fever 02/12/2014  . Acute pharyngitis 02/12/2014  . Influenza 02/12/2014  . Otitis media 02/12/2014  . Nonspecific elevation of levels of transaminase or lactic acid dehydrogenase (LDH) 10/05/2012  . Nonspecific abnormal electrocardiogram (ECG) (EKG) 10/05/2012  . Mixed hyperlipidemia 03/09/2010  . Seasonal and perennial allergic rhinitis 03/25/2009  . Allergic-infective asthma 03/25/2009  . DIPLOPIA, HX OF 03/25/2009    Zachery Conch MOT, OTR/L  03/20/2020, 6:24 PM  [facility] [address] [place], [place], [address] Phone: [phone]   Fax:  [phone]  Name: Linkoln Alkire MRN: [id number] Date of Birth: 08-Sep-1965

## 2020-03-25 ENCOUNTER — Ambulatory Visit: Payer: BC Managed Care – PPO | Admitting: Occupational Therapy

## 2020-03-25 ENCOUNTER — Other Ambulatory Visit: Payer: Self-pay

## 2020-03-25 ENCOUNTER — Encounter: Payer: Self-pay | Admitting: Occupational Therapy

## 2020-03-25 DIAGNOSIS — I69318 Other symptoms and signs involving cognitive functions following cerebral infarction: Secondary | ICD-10-CM

## 2020-03-25 DIAGNOSIS — M6281 Muscle weakness (generalized): Secondary | ICD-10-CM | POA: Diagnosis not present

## 2020-03-25 DIAGNOSIS — R41842 Visuospatial deficit: Secondary | ICD-10-CM | POA: Diagnosis not present

## 2020-03-25 DIAGNOSIS — R278 Other lack of coordination: Secondary | ICD-10-CM

## 2020-03-25 NOTE — Patient Instructions (Signed)
RETURN TO DRIVING PLAN:   Communicate with physician first and get clearance before beginning any driving.   Once cleared:  WITH THE SUPERVISION OF A LICENSED DRIVER, PLEASE DRIVE IN AN EMPTY PARKING LOT FOR AT LEAST 2-3 TRIALS TO TEST REACTION TIME, VISION, USE OF EQUIPMENT IN CAR, ETC.   IF SUCCESSFUL WITH THE PARKING LOT DRIVING, PROCEED TO SUPERVISED DRIVING TRIALS IN YOUR NEIGHBORHOOD STREETS AT LOW TRAFFIC TIMES TO TEST OBSERVATION TO TRAFFIC SIGNALS, REACTION TIME, ETC. PLEASE ATTEMPT AT LEAST 2-3 TRIALS IN YOUR NEIGHBORHOOD.   IF NEIGHBORHOOD DRIVING IS SUCCESSFUL, YOU MAY PROCEED TO DRIVING IN BUSIER AREAS IN YOUR COMMUNITY WITH SUPERVISION OF A LICENSED DRIVER. PLEASE ATTEMPT AT LEAST 4-5 TRIALS.   IF COMMUNITY DRIVING IS SUCCESSFUL, YOU MAY PROCEED TO DRIVING ALONE, DURING THE DAY TIME, IN NON-PEAK TRAFFIC TIMES. YOU SHOULD DRIVE NO FURTHER THAN 15 MINUTES IN ONE DIRECTION. PLEASE DO NOT DRIVE IF YOU FEEL FATIGUED OR UNDER THE INFLUENCE OF MEDICATION.   

## 2020-03-25 NOTE — Therapy (Signed)
[facility] [address] [place], [place], [address] Phone: [phone]   Fax:  [phone]  Occupational Therapy Treatment  Patient Details  Name: Corey Soto MRN: [id number] Date of Birth: [birth year] Referring Provider (OT): Dr. Caren Macadam   Encounter Date: 03/25/2020   OT End of Session - 03/25/20 1735    Visit Number 4    Number of Visits 9    Date for OT Re-Evaluation 04/08/20    Authorization Type BC/BS - VL 100 - FOTO at d/c    OT Start Time 1735    OT Stop Time 1815    OT Time Calculation (min) 40 min    Activity Tolerance Patient tolerated treatment well    Behavior During Therapy [facility] for tasks assessed/performed           Past Medical History:  Diagnosis Date  . Allergic rhinitis   . Asthma   . Hyperlipidemia    LDL 182.8    Past Surgical History:  Procedure Laterality Date  . WISDOM TOOTH EXTRACTION      There were no vitals filed for this visit.   Subjective Assessment - 03/25/20 1736    Subjective  "overall everything is getting better but typing is getting better but still a challenge"    Patient is accompanied by: Family member   wife   Pertinent History CVA with Rt hemiparesis 02/19/20. PMH: HLD, HTN, asthma    Limitations no driving (diplopia)    Patient Stated Goals to improve my writing    Currently in Pain? No/denies                        OT Treatments/Exercises (OP) - 03/25/20 1741      Fine Motor Coordination (Hand/Wrist)   Fine Motor Coordination Handwriting;Tracing    Handwriting writing animals for every letter of the alphabet with working on increasing legibility - noted difficulty with spelling and possible dysgraphia with writing letters. Pt had increased difficulty with word finding for some letters. Pt, then, wrote 5 sentences for 5 of the words wiht better word production and sentence production. Handwriting still not as legible as pre-stroke. Pt  reports most difficulty in hesitation of the spelling and control of hand. worked on word finding with words that rhyme with max difficulty this day.    Tracing connect the dots 1-100 with increasing visual motor coordination for increasing legibility and prewriting skills                  OT Education - 03/25/20 1739    Education Details reviewed return to driving recommendations    Person(s) Educated Patient    Methods Explanation;Handout    Comprehension Verbalized understanding               OT Long Term Goals - 03/20/20 1744      OT LONG TERM GOAL #1   Title Independent with HEP for coordination (including typing), grip strength, and diplopia    Time 4    Period Weeks    Status Achieved   diplopia resolved per pt report. pt also met grip strength goal 03/18/20     OT LONG TERM GOAL #2   Title Improve coordination as evidenced by reducing speed on 9 hole peg test to 21 sec or less Rt hand    Baseline 23.62 sec    Time 4    Period Weeks    Status On-going   23.34s  with RUE on 03/18/20     OT LONG TERM GOAL #3   Title Improve grip strength by 5 lbs or more Rt hand    Baseline Rt = 82 lbs (Lt = 90 lbs)    Time 4    Period Weeks    Status Achieved   97 lbs with RUE on 03/18/20     OT LONG TERM GOAL #4   Title Practice writing 3 sentences or more in reasonable amount of time maintaining legibility    Time 4    Period Weeks    Status On-going      OT LONG TERM GOAL #5   Title Pt to report improved diplopia in primary gaze using A/E prn    Time 4    Period Weeks    Status Achieved      OT LONG TERM GOAL #6   Title Pt to perform environmental scanning with physical task with 90% or greater accuracy in prep for return to driving    Time 4    Period Weeks    Status Achieved   100% accuracy 03/20/2020                Plan - 03/25/20 1816    Clinical Impression Statement Pt continues to have decreased legibility with handwriting and difficulty with word  finding. Pt continnues to progress towards goals.    OT Occupational Profile and History Problem Focused Assessment - Including review of records relating to presenting problem    Occupational performance deficits (Please refer to evaluation for details): IADL's;Work;Leisure    Body Structure / Function / Physical Skills Strength;Dexterity;GMC;UE functional use;Endurance;IADL;Vision;Coordination;FMC    Cognitive Skills Perception;Problem Solve;Attention    Clinical Decision Making Several treatment options, min-mod task modification necessary    Comorbidities Affecting Occupational Performance: May have comorbidities impacting occupational performance    Modification or Assistance to Complete Evaluation  No modification of tasks or assist necessary to complete eval    OT Frequency 2x / week    OT Duration 4 weeks   or 1x/wk for 8 weeks (depending on schedule and availability)   OT Treatment/Interventions Self-care/ADL training;DME and/or AE instruction;Fluidtherapy;Therapeutic activities;Therapeutic exercise;Cognitive remediation/compensation;Coping strategies training;Visual/perceptual remediation/compensation;Neuromuscular education;Manual Therapy;Patient/family education    Plan right handed typing, handwriting    Consulted and Agree with Plan of Care Patient           Patient will benefit from skilled therapeutic intervention in order to improve the following deficits and impairments:   Body Structure / Function / Physical Skills: Strength,Dexterity,GMC,UE functional use,Endurance,IADL,Vision,Coordination,FMC Cognitive Skills: Perception,Problem Solve,Attention     Visit Diagnosis: Other lack of coordination  Other symptoms and signs involving cognitive functions following cerebral infarction  Visuospatial deficit  Muscle weakness (generalized)    Problem List Patient Active Problem List   Diagnosis Date Noted  . Fever 02/12/2014  . Acute pharyngitis 02/12/2014  .  Influenza 02/12/2014  . Otitis media 02/12/2014  . Nonspecific elevation of levels of transaminase or lactic acid dehydrogenase (LDH) 10/05/2012  . Nonspecific abnormal electrocardiogram (ECG) (EKG) 10/05/2012  . Mixed hyperlipidemia 03/09/2010  . Seasonal and perennial allergic rhinitis 03/25/2009  . Allergic-infective asthma 03/25/2009  . DIPLOPIA, HX OF 03/25/2009    Zachery Conch MOT, OTR/L  03/25/2020, 6:17 PM  [facility] [address] [place], [place], [address] Phone: [phone]   Fax:  [phone]  Name: Corey Soto MRN: [id number] Date of Birth: 07-04-1965

## 2020-03-27 ENCOUNTER — Ambulatory Visit: Payer: BC Managed Care – PPO | Admitting: Occupational Therapy

## 2020-03-27 ENCOUNTER — Other Ambulatory Visit: Payer: Self-pay

## 2020-03-27 ENCOUNTER — Encounter: Payer: Self-pay | Admitting: Occupational Therapy

## 2020-03-27 DIAGNOSIS — R41842 Visuospatial deficit: Secondary | ICD-10-CM

## 2020-03-27 DIAGNOSIS — M6281 Muscle weakness (generalized): Secondary | ICD-10-CM | POA: Diagnosis not present

## 2020-03-27 DIAGNOSIS — R278 Other lack of coordination: Secondary | ICD-10-CM | POA: Diagnosis not present

## 2020-03-27 DIAGNOSIS — I69318 Other symptoms and signs involving cognitive functions following cerebral infarction: Secondary | ICD-10-CM

## 2020-03-27 NOTE — Therapy (Signed)
[facility] [address] [place], [place], [address] Phone: [phone]   Fax:  [phone]  Occupational Therapy Treatment  Patient Details  Name: Corey Soto MRN: [id number] Date of Birth: 10/23/1965 Referring Provider (OT): Dr. Rachel Hagler   Encounter Date: 03/27/2020   OT End of Session - 03/27/20 1744    Visit Number 5    Number of Visits 9    Date for OT Re-Evaluation 04/08/20    Authorization Type BC/BS - VL 100 - FOTO at d/c    OT Start Time 1744    OT Stop Time 1825    OT Time Calculation (min) 41 min    Activity Tolerance Patient tolerated treatment well    Behavior During Therapy [facility] for tasks assessed/performed           Past Medical History:  Diagnosis Date  . Allergic rhinitis   . Asthma   . Hyperlipidemia    LDL 182.8    Past Surgical History:  Procedure Laterality Date  . WISDOM TOOTH EXTRACTION      There were no vitals filed for this visit.   Subjective Assessment - 03/27/20 1744    Subjective  Pt denies any pain. "typing is still a challenge and mispelling"    Patient is accompanied by: Family member   wife   Pertinent History CVA with Rt hemiparesis 02/19/20. PMH: HLD, HTN, asthma    Limitations no driving (diplopia)    Patient Stated Goals to improve my writing    Currently in Pain? No/denies                 Treatment: Typing numbers and copying them with 100% accuracy Typing Master test - 16 wpm 84% accuracy and 13 net speed wpm, Clouds game Golf tees and small beads with  RUE for increase in fine motor coordination. Removed with in hand manipulation. Used tip pinch for beads. Typing Game online with score of 13 wpm Alternating alphabetizing words (uppercase and lowercase) on level 3 on Constant Therapy - 94% Accuracy and 54.81s response time.               OT Long Term Goals - 03/20/20 1744      OT LONG TERM GOAL #1   Title Independent with HEP for  coordination (including typing), grip strength, and diplopia    Time 4    Period Weeks    Status Achieved   diplopia resolved per pt report. pt also met grip strength goal 03/18/20     OT LONG TERM GOAL #2   Title Improve coordination as evidenced by reducing speed on 9 hole peg test to 21 sec or less Rt hand    Baseline 23.62 sec    Time 4    Period Weeks    Status On-going   23.34s with RUE on 03/18/20     OT LONG TERM GOAL #3   Title Improve grip strength by 5 lbs or more Rt hand    Baseline Rt = 82 lbs (Lt = 90 lbs)    Time 4    Period Weeks    Status Achieved   97 lbs with RUE on 03/18/20     OT LONG TERM GOAL #4   Title Practice writing 3 sentences or more in reasonable amount of time maintaining legibility    Time 4    Period Weeks    Status On-going      OT LONG TERM GOAL #5     Title Pt to report improved diplopia in primary gaze using A/E prn    Time 4    Period Weeks    Status Achieved      OT LONG TERM GOAL #6   Title Pt to perform environmental scanning with physical task with 90% or greater accuracy in prep for return to driving    Time 4    Period Weeks    Status Achieved   100% accuracy 03/20/2020                Plan - 03/27/20 1755    Clinical Impression Statement Pt is making progress with typing and with fine motor coordination but continues to have difficulty with these tasks.    OT Occupational Profile and History Problem Focused Assessment - Including review of records relating to presenting problem    Occupational performance deficits (Please refer to evaluation for details): IADL's;Work;Leisure    Body Structure / Function / Physical Skills Strength;Dexterity;GMC;UE functional use;Endurance;IADL;Vision;Coordination;FMC    Cognitive Skills Perception;Problem Solve;Attention    Clinical Decision Making Several treatment options, min-mod task modification necessary    Comorbidities Affecting Occupational Performance: May have comorbidities impacting  occupational performance    Modification or Assistance to Complete Evaluation  No modification of tasks or assist necessary to complete eval    OT Frequency 2x / week    OT Duration 4 weeks   or 1x/wk for 8 weeks (depending on schedule and availability)   OT Treatment/Interventions Self-care/ADL training;DME and/or AE instruction;Fluidtherapy;Therapeutic activities;Therapeutic exercise;Cognitive remediation/compensation;Coping strategies training;Visual/perceptual remediation/compensation;Neuromuscular education;Manual Therapy;Patient/family education    Plan right handed typing, handwriting    Consulted and Agree with Plan of Care Patient           Patient will benefit from skilled therapeutic intervention in order to improve the following deficits and impairments:   Body Structure / Function / Physical Skills: Strength,Dexterity,GMC,UE functional use,Endurance,IADL,Vision,Coordination,FMC Cognitive Skills: Perception,Problem Solve,Attention     Visit Diagnosis: Other lack of coordination  Other symptoms and signs involving cognitive functions following cerebral infarction  Visuospatial deficit  Muscle weakness (generalized)    Problem List Patient Active Problem List   Diagnosis Date Noted  . Fever 02/12/2014  . Acute pharyngitis 02/12/2014  . Influenza 02/12/2014  . Otitis media 02/12/2014  . Nonspecific elevation of levels of transaminase or lactic acid dehydrogenase (LDH) 10/05/2012  . Nonspecific abnormal electrocardiogram (ECG) (EKG) 10/05/2012  . Mixed hyperlipidemia 03/09/2010  . Seasonal and perennial allergic rhinitis 03/25/2009  . Allergic-infective asthma 03/25/2009  . DIPLOPIA, HX OF 03/25/2009    Zachery Conch MOT, OTR/L  03/27/2020, 6:25 PM  [facility] [address] [place], [place], [address] Phone: [phone]   Fax:  [phone]  Name: Corey Soto MRN: [id number] Date of  Birth: Jan 06, 1966

## 2020-04-01 ENCOUNTER — Other Ambulatory Visit: Payer: Self-pay

## 2020-04-01 ENCOUNTER — Encounter: Payer: Self-pay | Admitting: Occupational Therapy

## 2020-04-01 ENCOUNTER — Ambulatory Visit: Payer: BC Managed Care – PPO | Admitting: Occupational Therapy

## 2020-04-01 DIAGNOSIS — I69318 Other symptoms and signs involving cognitive functions following cerebral infarction: Secondary | ICD-10-CM | POA: Diagnosis not present

## 2020-04-01 DIAGNOSIS — R41842 Visuospatial deficit: Secondary | ICD-10-CM | POA: Diagnosis not present

## 2020-04-01 DIAGNOSIS — M6281 Muscle weakness (generalized): Secondary | ICD-10-CM

## 2020-04-01 DIAGNOSIS — R278 Other lack of coordination: Secondary | ICD-10-CM

## 2020-04-01 NOTE — Patient Instructions (Signed)
RIGHT HANDED TYPING WORDS pink oink pill hike hip omen play plan king noon plug jug mom pop mop him moon lime poultry fiction lint bundle typing nominal your boil coil soil numb pick lick humble jump hump bunk funk plump dump out tuition motion notion potion lotion jungle buy book hook nun none  

## 2020-04-01 NOTE — Therapy (Signed)
[facility] [address] [place], [place], [address] Phone: [phone]   Fax:  [phone]  Occupational Therapy Treatment  Patient Details  Name: Corey Soto MRN: [id number] Date of Birth: [birth year] Referring Provider (OT): Dr. Caren Macadam   Encounter Date: 04/01/2020   OT End of Session - 04/01/20 1725    Visit Number 6    Number of Visits 9    Date for OT Re-Evaluation 04/08/20    Authorization Type BC/BS - VL 100 - FOTO at d/c    OT Start Time 1724    OT Stop Time 1804    OT Time Calculation (min) 40 min    Activity Tolerance Patient tolerated treatment well    Behavior During Therapy [facility] for tasks assessed/performed           Past Medical History:  Diagnosis Date  . Allergic rhinitis   . Asthma   . Hyperlipidemia    LDL 182.8    Past Surgical History:  Procedure Laterality Date  . WISDOM TOOTH EXTRACTION      There were no vitals filed for this visit.   Subjective Assessment - 04/01/20 1725    Subjective  Pt reports he thinks his handwriting is getting better.    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 PMH: HLD, HTN, asthma    Limitations no driving (diplopia)    Patient Stated Goals to improve my writing    Currently in Pain? No/denies              TREATMENT  Goals: assessed remaining goals. See below.  Typing: right handed typing words with good time and increased accuracy. Pt continues to report difficulty with typing.  Constant Therapy: Read a Map level 3 with 80% accuracy and 36.27s response time. Alphabetizing alternating words (uppercase/lowercase) Level 3 with 89% accuracy and 58.80s response time. Difficulty mostly with attending to details and possibly with mixing up letters with alphabetizing.  Discussed apps and games that could be benficial for attention and word/letter finding.      OT Education - 04/01/20 1750    Education Details issued right  handed typing words    Person(s) Educated Patient    Methods Explanation;Handout;Demonstration    Comprehension Verbalized understanding;Returned demonstration               OT Long Term Goals - 04/01/20 1726      OT LONG TERM GOAL #1   Title Independent with HEP for coordination (including typing), grip strength, and diplopia    Time 4    Period Weeks    Status On-going   pt continues to work towards coordination and typing     OT LONG TERM GOAL #2   Title Improve coordination as evidenced by reducing speed on 9 hole peg test to 21 sec or less Rt hand    Baseline 23.62 sec    Time 4    Period Weeks    Status Achieved   20.56s on 04/01/20     OT LONG TERM GOAL #3   Title Improve grip strength by 5 lbs or more Rt hand    Baseline Rt = 82 lbs (Lt = 90 lbs)    Time 4    Period Weeks    Status Achieved   97 lbs with RUE on 03/18/20     OT LONG TERM GOAL #4   Title Practice writing 3 sentences or more in reasonable amount of time maintaining legibility  Time 4    Period Weeks    Status On-going      OT LONG TERM GOAL #5   Title Pt to report improved diplopia in primary gaze using A/E prn    Time 4    Period Weeks    Status Achieved      OT LONG TERM GOAL #6   Title Pt to perform environmental scanning with physical task with 90% or greater accuracy in prep for return to driving    Time 4    Period Weeks    Status Achieved   100% accuracy 03/20/2020                Plan - 04/01/20 1736    Clinical Impression Statement Pt has met 4/6 LTGs and continues to work towards coordination, handwriting and typing. Deficits noted with cognition and mixing up letters.    OT Occupational Profile and History Problem Focused Assessment - Including review of records relating to presenting problem    Occupational performance deficits (Please refer to evaluation for details): IADL's;Work;Leisure    Body Structure / Function / Physical Skills Strength;Dexterity;GMC;UE functional  use;Endurance;IADL;Vision;Coordination;FMC    Cognitive Skills Perception;Problem Solve;Attention    Clinical Decision Making Several treatment options, min-mod task modification necessary    Comorbidities Affecting Occupational Performance: May have comorbidities impacting occupational performance    Modification or Assistance to Complete Evaluation  No modification of tasks or assist necessary to complete eval    OT Frequency 2x / week    OT Duration 4 weeks   or 1x/wk for 8 weeks (depending on schedule and availability)   OT Treatment/Interventions Self-care/ADL training;DME and/or AE instruction;Fluidtherapy;Therapeutic activities;Therapeutic exercise;Cognitive remediation/compensation;Coping strategies training;Visual/perceptual remediation/compensation;Neuromuscular education;Manual Therapy;Patient/family education    Plan check remaining goals and discharge?    Consulted and Agree with Plan of Care Patient           Patient will benefit from skilled therapeutic intervention in order to improve the following deficits and impairments:   Body Structure / Function / Physical Skills: Strength,Dexterity,GMC,UE functional use,Endurance,IADL,Vision,Coordination,FMC Cognitive Skills: Perception,Problem Solve,Attention     Visit Diagnosis: Other lack of coordination  Other symptoms and signs involving cognitive functions following cerebral infarction  Visuospatial deficit  Muscle weakness (generalized)    Problem List Patient Active Problem List   Diagnosis Date Noted  . Fever 02/12/2014  . Acute pharyngitis 02/12/2014  . Influenza 02/12/2014  . Otitis media 02/12/2014  . Nonspecific elevation of levels of transaminase or lactic acid dehydrogenase (LDH) 10/05/2012  . Nonspecific abnormal electrocardiogram (ECG) (EKG) 10/05/2012  . Mixed hyperlipidemia 03/09/2010  . Seasonal and perennial allergic rhinitis 03/25/2009  . Allergic-infective asthma 03/25/2009  . DIPLOPIA, HX OF  03/25/2009    Zachery Conch MOT, OTR/L  04/01/2020, 6:04 PM  [facility] [address] [place], [place], [address] Phone: [phone]   Fax:  [phone]  Name: Glendell Fouse MRN: [id number] Date of Birth: 03/06/1965

## 2020-04-03 ENCOUNTER — Other Ambulatory Visit: Payer: Self-pay

## 2020-04-03 ENCOUNTER — Encounter: Payer: Self-pay | Admitting: Occupational Therapy

## 2020-04-03 ENCOUNTER — Ambulatory Visit: Payer: BC Managed Care – PPO | Admitting: Occupational Therapy

## 2020-04-03 DIAGNOSIS — R41842 Visuospatial deficit: Secondary | ICD-10-CM

## 2020-04-03 DIAGNOSIS — I69318 Other symptoms and signs involving cognitive functions following cerebral infarction: Secondary | ICD-10-CM

## 2020-04-03 DIAGNOSIS — R278 Other lack of coordination: Secondary | ICD-10-CM

## 2020-04-03 DIAGNOSIS — M6281 Muscle weakness (generalized): Secondary | ICD-10-CM

## 2020-04-03 NOTE — Therapy (Signed)
[facility] [address] [place], [place], [address] Phone: [phone]   Fax:  [phone]  Occupational Therapy Treatment  Patient Details  Name: Corey Soto MRN: [id number] Date of Birth: 10/13/1965 Referring Provider (OT): Dr. Caren Macadam   Encounter Date: 04/03/2020   OT End of Session - 04/03/20 1742    Visit Number 7    Number of Visits 9    Date for OT Re-Evaluation 04/08/20    Authorization Type BC/BS - VL 100 - FOTO at d/c    OT Start Time 1742    OT Stop Time 1825    OT Time Calculation (min) 43 min    Activity Tolerance Patient tolerated treatment well    Behavior During Therapy [facility] for tasks assessed/performed           Past Medical History:  Diagnosis Date  . Allergic rhinitis   . Asthma   . Hyperlipidemia    LDL 182.8    Past Surgical History:  Procedure Laterality Date  . WISDOM TOOTH EXTRACTION      There were no vitals filed for this visit.   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 PMH: HLD, HTN, asthma    Limitations no driving (diplopia)    Patient Stated Goals to improve my writing    Currently in Pain? No/denies             OCCUPATIONAL THERAPY DISCHARGE SUMMARY  Visits from Start of Care: 7  Current functional level related to goals / functional outcomes: Pt has met all goals and is ready for discharge. Pt has benefited from skilled OT to increase coordination and strength in RUE.    Remaining deficits: Continues to have deficits with typing speed and handwriting.    Education / Equipment: HEPs for coordination and grip strength. Issued typing practice sites.  Plan: Patient agrees to discharge.  Patient goals were met. Patient is being discharged due to meeting the stated rehab goals.  ?????            TREATMENT:  Typing Test: 15 wpm, 80% accuracy and 12  wpm net speed.  Handwriting: wrote 3 sentences. 100% legibility but reports not like it used to be.  [place] with tweezers to place in board and remove with RUE with no difficulty and appropriate time  Constant Therapy: Alternating Symbols Level 6 with 94% accuracy and 28.29s response speed. Alphabetizing alternating words uppercase/lowercase with 93% accuracy and 34.54s response time.              OT Long Term Goals - 04/03/20 1752      OT LONG TERM GOAL #1   Title Independent with HEP for coordination (including typing), grip strength, and diplopia    Time 4    Period Weeks    Status Achieved   pt continues to work towards coordination and typing     OT LONG TERM GOAL #2   Title Improve coordination as evidenced by reducing speed on 9 hole peg test to 21 sec or less Rt hand    Baseline 23.62 sec    Time 4    Period Weeks    Status Achieved   20.56s on 04/01/20     OT LONG TERM GOAL #3   Title Improve grip strength by 5 lbs or more Rt hand    Baseline Rt = 82 lbs (Lt =  90 lbs)    Time 4    Period Weeks    Status Achieved   97 lbs with RUE on 03/18/20     OT LONG TERM GOAL #4   Title Practice writing 3 sentences or more in reasonable amount of time maintaining legibility    Time 4    Period Weeks    Status Achieved      OT LONG TERM GOAL #5   Title Pt to report improved diplopia in primary gaze using A/E prn    Time 4    Period Weeks    Status Achieved      OT LONG TERM GOAL #6   Title Pt to perform environmental scanning with physical task with 90% or greater accuracy in prep for return to driving    Time 4    Period Weeks    Status Achieved   100% accuracy 03/20/2020                Plan - 04/03/20 1743    Clinical Impression Statement Pt has met all goals and is ready for discharge from OT. Pt continues to have deficits with handwriting and typing but was issued strategies and practice sites for continuing to improve.    OT Occupational Profile and  History Problem Focused Assessment - Including review of records relating to presenting problem    Occupational performance deficits (Please refer to evaluation for details): IADL's;Work;Leisure    Body Structure / Function / Physical Skills Strength;Dexterity;GMC;UE functional use;Endurance;IADL;Vision;Coordination;FMC    Cognitive Skills Perception;Problem Solve;Attention    Clinical Decision Making Several treatment options, min-mod task modification necessary    Comorbidities Affecting Occupational Performance: May have comorbidities impacting occupational performance    Modification or Assistance to Complete Evaluation  No modification of tasks or assist necessary to complete eval    OT Frequency 2x / week    OT Duration 4 weeks   or 1x/wk for 8 weeks (depending on schedule and availability)   OT Treatment/Interventions Self-care/ADL training;DME and/or AE instruction;Fluidtherapy;Therapeutic activities;Therapeutic exercise;Cognitive remediation/compensation;Coping strategies training;Visual/perceptual remediation/compensation;Neuromuscular education;Manual Therapy;Patient/family education    Plan discharge OT    Consulted and Agree with Plan of Care Patient           Patient will benefit from skilled therapeutic intervention in order to improve the following deficits and impairments:   Body Structure / Function / Physical Skills: Strength,Dexterity,GMC,UE functional use,Endurance,IADL,Vision,Coordination,FMC Cognitive Skills: Perception,Problem Solve,Attention     Visit Diagnosis: Other lack of coordination  Other symptoms and signs involving cognitive functions following cerebral infarction  Visuospatial deficit  Muscle weakness (generalized)    Problem List Patient Active Problem List   Diagnosis Date Noted  . Fever 02/12/2014  . Acute pharyngitis 02/12/2014  . Influenza 02/12/2014  . Otitis media 02/12/2014  . Nonspecific elevation of levels of transaminase or lactic  acid dehydrogenase (LDH) 10/05/2012  . Nonspecific abnormal electrocardiogram (ECG) (EKG) 10/05/2012  . Mixed hyperlipidemia 03/09/2010  . Seasonal and perennial allergic rhinitis 03/25/2009  . Allergic-infective asthma 03/25/2009  . DIPLOPIA, HX OF 03/25/2009    Zachery Conch MOT, OTR/L  04/03/2020, 6:13 PM  [facility] [address] [place], [place], [address] Phone: [phone]   Fax:  [phone]  Name: Corey Soto MRN: [id number] Date of Birth: Nov 07, 1965

## 2020-04-03 NOTE — Patient Instructions (Signed)
Typing Practicing:  [email or web address]

## 2020-04-08 ENCOUNTER — Ambulatory Visit: Payer: BC Managed Care – PPO | Admitting: Occupational Therapy

## 2020-04-10 ENCOUNTER — Ambulatory Visit: Payer: BC Managed Care – PPO | Admitting: Occupational Therapy

## 2020-04-17 DIAGNOSIS — E118 Type 2 diabetes mellitus with unspecified complications: Secondary | ICD-10-CM | POA: Diagnosis not present

## 2020-04-17 DIAGNOSIS — E782 Mixed hyperlipidemia: Secondary | ICD-10-CM | POA: Diagnosis not present

## 2020-04-24 IMAGING — DX DG KNEE COMPLETE 4+V*L*
4 series · 4 of 4 positions shown · non-contrast
Comparison: None.

CLINICAL DATA: Left knee injury 1 week ago while working out.
Worsening knee pain. Initial encounter.

EXAM:
LEFT KNEE - COMPLETE 4+ VIEW

[knee ap]
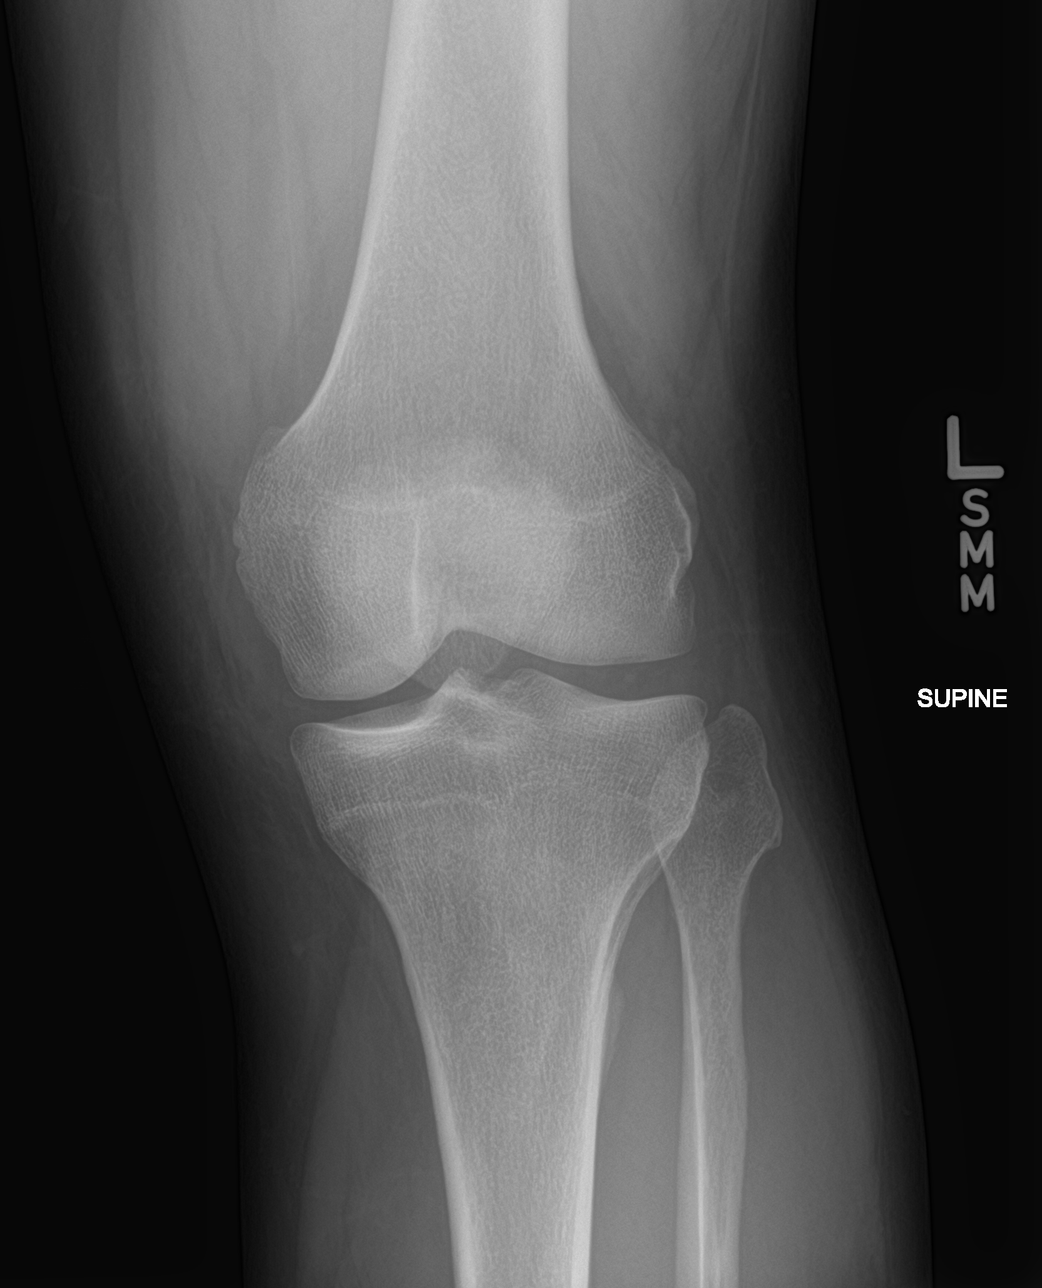

[knee obl (1 of 2)]
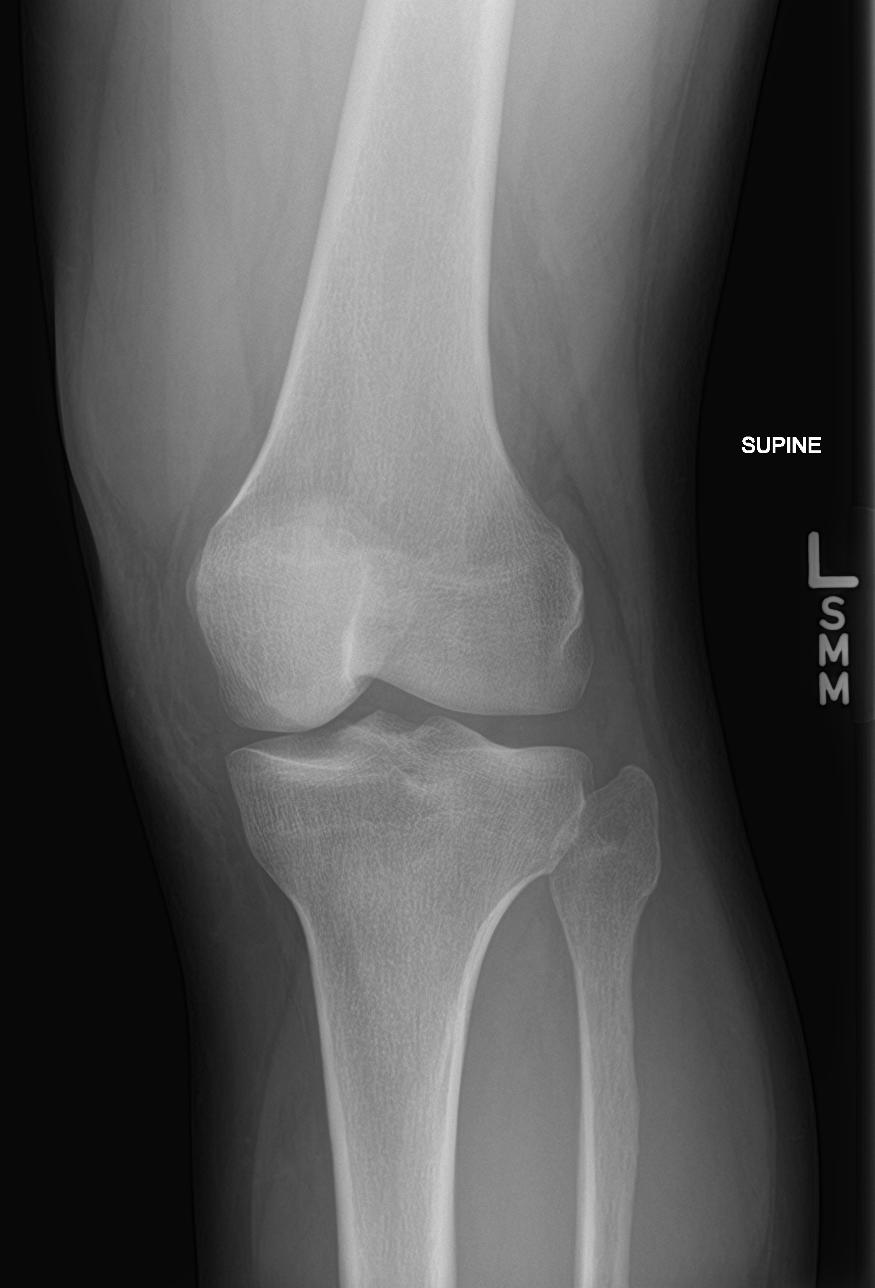

[knee obl (2 of 2)]
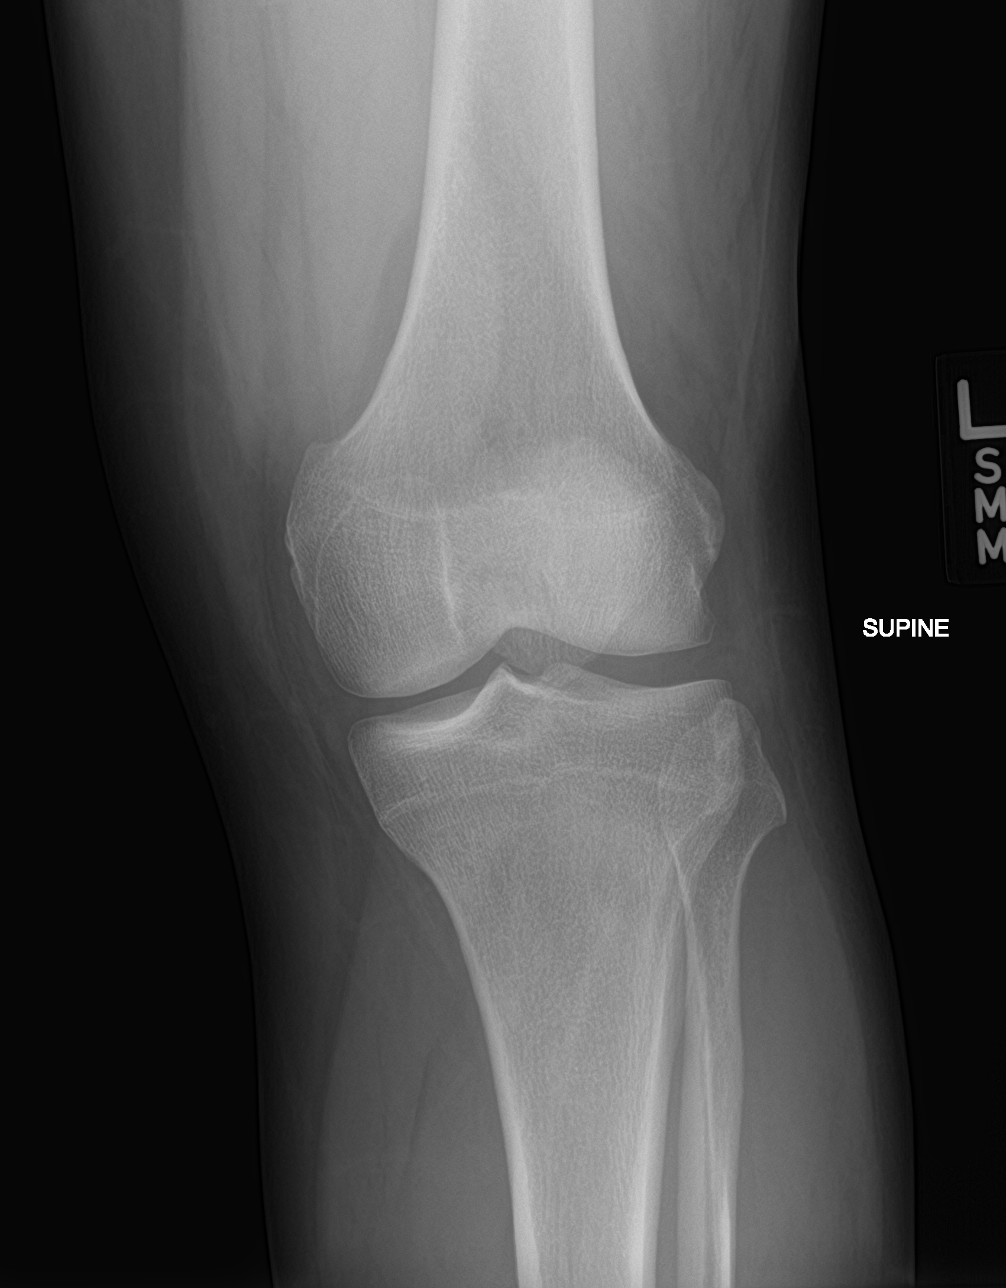

[knee lat]
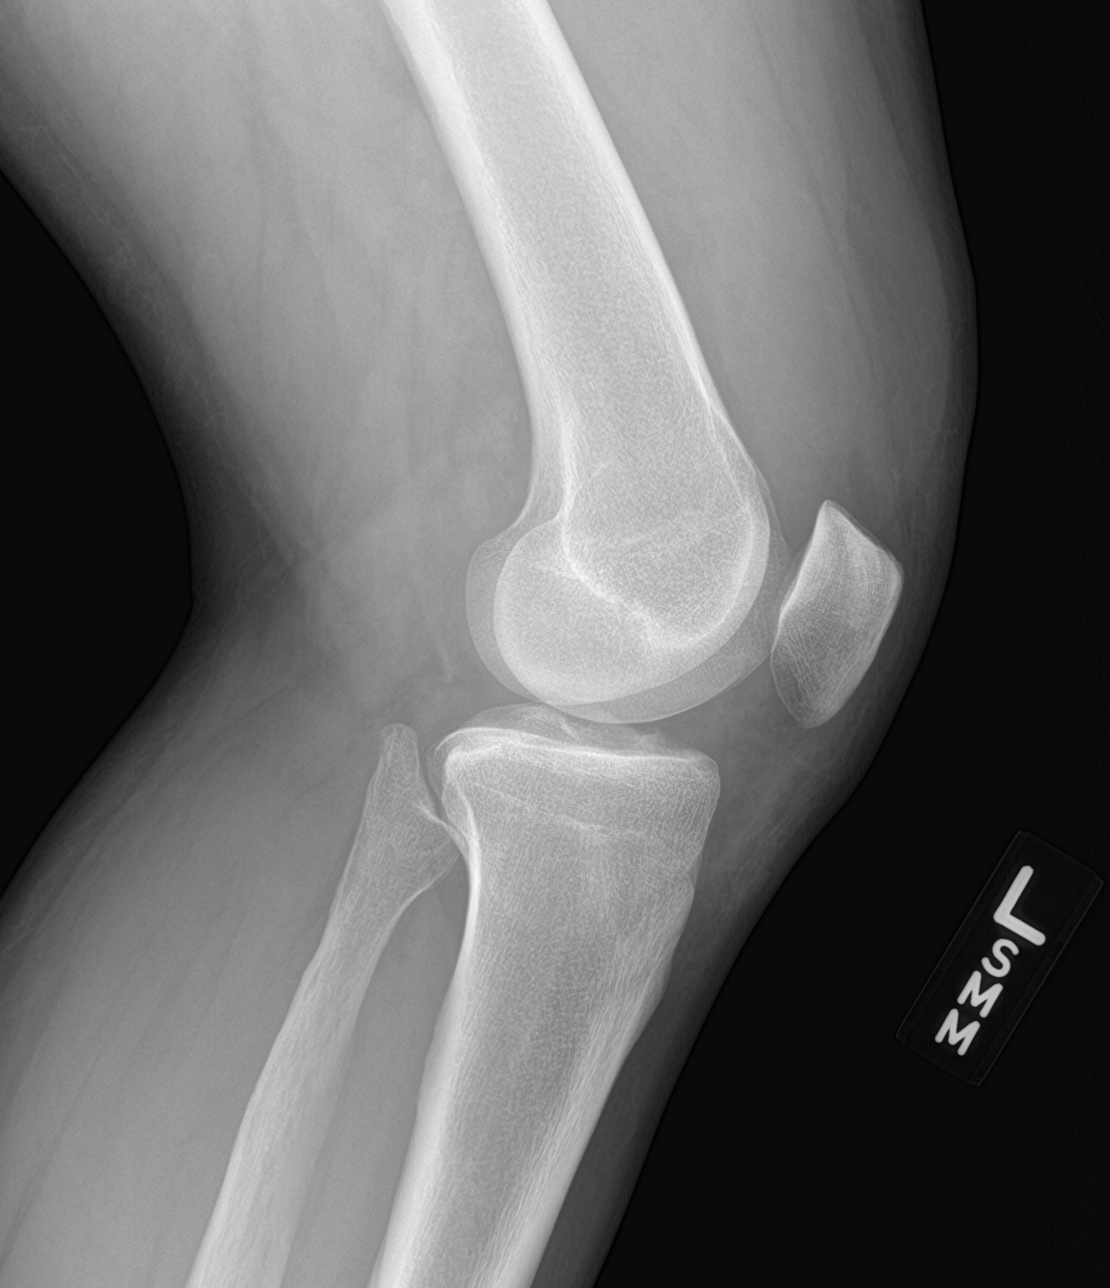

[4 of 4 positions shown; findings below may reference images not displayed]

FINDINGS: No evidence of fracture or dislocation. Small to moderate knee joint
effusion noted. No evidence of arthropathy or other focal bone
abnormality. Soft tissues are unremarkable.
IMPRESSION: Small to moderate knee joint effusion.  No osseous abnormality.

## 2020-04-25 ENCOUNTER — Encounter: Payer: Self-pay | Admitting: Cardiology

## 2020-04-25 ENCOUNTER — Other Ambulatory Visit: Payer: Self-pay

## 2020-04-25 ENCOUNTER — Ambulatory Visit: Payer: BC Managed Care – PPO | Admitting: Cardiology

## 2020-04-25 VITALS — BP 135/83 | HR 66 | Temp 98.2°F | Resp 16 | Ht 64.0 in | Wt 192.0 lb

## 2020-04-25 DIAGNOSIS — I4891 Unspecified atrial fibrillation: Secondary | ICD-10-CM

## 2020-04-25 DIAGNOSIS — I482 Chronic atrial fibrillation, unspecified: Secondary | ICD-10-CM

## 2020-04-25 DIAGNOSIS — Z136 Encounter for screening for cardiovascular disorders: Secondary | ICD-10-CM

## 2020-04-25 NOTE — Progress Notes (Signed)
Patient referred by Caren Macadam, MD for stroke  Subjective:   Corey Soto, male    DOB: 1965/07/11, 56 y.o.   MRN: [id number]   Chief Complaint  Patient presents with  . Atrial Fibrillation  . Abnormal ECG  . New Patient (Initial Visit)  . Cerebrovascular Accident     HPI  55 year old Caucasian male with hypertension, mixed hyperlipidemia, type 2 diabetes mellitus, asthma, atrial fibrillation, h/o stroke  Patient was hospitalized at [facility] in 02/2020 with acute CVA.  On 02/19/2020 at 1800, patient was discovered to have these stroke-like symptoms while sitting in car.The patient had come to the hospital to see his mom, who was admitted, but she had already been discharged to SNF. He was upset, and when he came back to car he noted that he was having left arm numbness while texting.. Then he felt dizzy (said it was "lightheadedness" but he was also having gait issues) and he started seeing double in the horizontal direction. He came to ED and and reported nausea and diaphoresis. The patient reports similar symptoms many years ago when he had similar dizziness and recalls having vomiting but does not recall if he had diplopia.     Patient was not a candidate for IV tPA due to the following contraindications, warnings and/or other factors: NIHSS 0, suspected recrudescence, quickly resolving symptoms   The patient was subsequently admitted for observation in the CDU. MRI brain WO contrast was obtained that demonstrated multifocal infarcts and the patient was subsequently admitted to the Neurology Stroke service for further evaluation and management.   Given aforementioned workup the patient's presenting symptoms were felt to be most likely secondary to right PCA and left MCA CVA which was likely due to cardioembolism in the setting of history of diagnosis of atrial fibrillation and not on anticoagulation. The patient was started on Xarelto 20 mg daily and  Lipitor 80 mg daily for future stroke prevention which should be continued upon discharge. While in house the patient worked with PT/OT who recommended home with PRN assistance and outpatient occupational therapy. On 02/20/20, the patient was deemed medically stable for discharge from the hospital to Home with close PCP and Neurology follow up.  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 He was discharged on Xrelto 20 mg daily and Lipitor 80 mg daily. Etiology was felt to be cardioembolic. He was discharged Xarelto 20 mg and Atorvastatin 80 mg.  Patient is here today with his significant other.  He denies chest pain, shortness of breath, palpitations, leg edema, orthopnea, PND, TIA/syncope.  His only deficit from the stroke is difficulty writing with her right hand, and being right-handed.    Past Medical History:  Diagnosis Date  . Allergic rhinitis   . Asthma   . Hyperlipidemia    LDL 182.8     Past Surgical History:  Procedure Laterality Date  . WISDOM TOOTH EXTRACTION       Social History   Tobacco Use  Smoking Status Never Smoker  Smokeless Tobacco Never Used    Social History   Substance and Sexual Activity  Alcohol Use No     Family History  Problem Relation Age of Onset  . Lung cancer Maternal Grandmother        smoker  . Stroke Daughter        in teens (BCP & smoking)  . Heart attack Maternal Grandfather        >  20  . Healthy Mother   . Heart disease Father   . Diabetes Neg Hx      Current Outpatient Medications on File Prior to Visit  Medication Sig Dispense Refill  . albuterol (VENTOLIN HFA) 108 (90 Base) MCG/ACT inhaler Inhale 2 puffs into the lungs 4 (four) times daily as needed for wheezing. 3 Inhaler 3  . atorvastatin (LIPITOR) 80 MG tablet Take 80 mg by mouth daily.    Marland Kitchen ibuprofen (ADVIL,MOTRIN) 800 MG tablet Take 1 tablet (800 mg total) by mouth every 8 (eight) hours as needed for  moderate pain. (Patient not taking: Reported on 03/11/2020) 90 tablet 0  . Multiple Vitamin (MULTIVITAMIN) tablet Take 1 tablet by mouth daily.   (Patient not taking: Reported on 03/11/2020)    . rivaroxaban (XARELTO) 20 MG TABS tablet Take 20 mg by mouth daily with supper.     No current facility-administered medications on file prior to visit.    Cardiovascular and other pertinent studies:  EKG 04/25/2020: Probable sinus rhythm 56 bpm with rate variation Nonspecific T-abnormality  Low voltage   Echocardiogram 02/20/2020: LVEF 60-65%. Impaired relaxation. No obvious large intracardiac shunts seen.  There is no significant valvular stenosis or regurgitation.  There is normal left ventricular wall thickness.    MRI brain 02/20/2020: 1. Acute/subacute infarcts of the right cerebellum with associated local edema and no acute intraparenchymal hemorrhage or mass effect. Additional punctate infarcts including the left parietal lobe and dorsal right pons/superior cerebellar peduncle with associated T2/FLAIR hyperintensity and no acute hemorrhage. Given the multifocality consider embolic source.  2. Sequela of chronic small vessel disease with remote lacunar infarcts of the bilateral cerebellum and right periventricular white      Recent labs: 04/17/2020: Glucose 80, BUN/Cr 19/0.95. EGFR 94. Na/K 138/4.3. Rest of the CMP normal HbA1C 6.1% Chol 123, TG 98, HDL 38, LDL 66 TSH N/A    Review of Systems  Cardiovascular: Negative for chest pain, dyspnea on exertion, leg swelling, palpitations and syncope.         Vitals:   04/25/20 1209  BP: 135/83  Pulse: 66  Resp: 16  Temp: 98.2 F (36.8 C)  SpO2: 98%     Body mass index is 32.96 kg/m. Filed Weights   04/25/20 1209  Weight: 192 lb (87.1 kg)     Objective:   Physical Exam Vitals and nursing note reviewed.  Constitutional:      General: He is not in acute distress. Neck:     Vascular: No JVD.  Cardiovascular:      Rate and Rhythm: Normal rate and regular rhythm.     Pulses: Normal pulses.     Heart sounds: Normal heart sounds. No murmur heard.   Pulmonary:     Effort: Pulmonary effort is normal.     Breath sounds: Normal breath sounds. No wheezing or rales.  Musculoskeletal:     Right lower leg: No edema.     Left lower leg: No edema.            Assessment & Recommendations:   55 year old Caucasian male with hypertension, mixed hyperlipidemia, type 2 diabetes mellitus, asthma, atrial fibrillation, h/o stroke  Stroke: Most likely etiology paroxysmal A. Fib. Now on Xarelto 20 mg daily given high chads vas score. Continue aggressive risk factor modification, including glycemic and lipid control. A1c 6.1%, LDL 66  Cardiac risk stratification: Given his cardiac risk factors, recommend exercise nuclear stress test  Further recommendations after above testing  Thank you for  referring the patient to [place]. Please feel free to contact with any questions.   Nigel Mormon, MD Pager: [phone] Office: [phone]

## 2020-04-26 ENCOUNTER — Encounter: Payer: Self-pay | Admitting: Cardiology

## 2020-05-01 ENCOUNTER — Other Ambulatory Visit: Payer: Self-pay

## 2020-05-01 ENCOUNTER — Ambulatory Visit: Payer: BC Managed Care – PPO

## 2020-05-01 DIAGNOSIS — I482 Chronic atrial fibrillation, unspecified: Secondary | ICD-10-CM | POA: Diagnosis not present

## 2020-05-01 DIAGNOSIS — Z136 Encounter for screening for cardiovascular disorders: Secondary | ICD-10-CM

## 2020-06-13 DIAGNOSIS — E119 Type 2 diabetes mellitus without complications: Secondary | ICD-10-CM | POA: Diagnosis not present

## 2020-06-13 DIAGNOSIS — H532 Diplopia: Secondary | ICD-10-CM | POA: Diagnosis not present

## 2020-06-13 DIAGNOSIS — H4922 Sixth [abducent] nerve palsy, left eye: Secondary | ICD-10-CM | POA: Diagnosis not present

## 2020-07-17 DIAGNOSIS — I480 Paroxysmal atrial fibrillation: Secondary | ICD-10-CM | POA: Diagnosis not present

## 2020-07-17 DIAGNOSIS — E782 Mixed hyperlipidemia: Secondary | ICD-10-CM | POA: Diagnosis not present

## 2020-07-17 DIAGNOSIS — Z23 Encounter for immunization: Secondary | ICD-10-CM | POA: Diagnosis not present

## 2020-07-17 DIAGNOSIS — Z7901 Long term (current) use of anticoagulants: Secondary | ICD-10-CM | POA: Diagnosis not present

## 2020-07-17 DIAGNOSIS — E118 Type 2 diabetes mellitus with unspecified complications: Secondary | ICD-10-CM | POA: Diagnosis not present

## 2020-09-12 DIAGNOSIS — H4922 Sixth [abducent] nerve palsy, left eye: Secondary | ICD-10-CM | POA: Diagnosis not present

## 2020-09-12 DIAGNOSIS — H5051 Esophoria: Secondary | ICD-10-CM | POA: Diagnosis not present

## 2020-09-12 DIAGNOSIS — H532 Diplopia: Secondary | ICD-10-CM | POA: Diagnosis not present

## 2020-10-25 ENCOUNTER — Ambulatory Visit: Payer: BC Managed Care – PPO | Admitting: Cardiology

## 2020-10-31 ENCOUNTER — Ambulatory Visit: Payer: BC Managed Care – PPO | Admitting: Cardiology

## 2020-10-31 ENCOUNTER — Other Ambulatory Visit: Payer: Self-pay

## 2020-10-31 ENCOUNTER — Encounter: Payer: Self-pay | Admitting: Cardiology

## 2020-10-31 VITALS — BP 139/82 | HR 68 | Temp 98.4°F | Resp 16 | Ht 64.0 in | Wt 190.0 lb

## 2020-10-31 DIAGNOSIS — I480 Paroxysmal atrial fibrillation: Secondary | ICD-10-CM | POA: Diagnosis not present

## 2020-10-31 MED ORDER — RIVAROXABAN 20 MG PO TABS: 20.0000 mg | ORAL_TABLET | Freq: Every day | ORAL | 3 refills | Status: AC

## 2020-10-31 NOTE — Progress Notes (Signed)
Patient referred by Caren Macadam, MD for stroke  Subjective:   Ruston Fedora, male    DOB: 1965/08/09, 55 y.o.   MRN: [id number]   Chief Complaint  Patient presents with   Atrial fibrillation, chronic    Follow-up    6 month     HPI  55 year old Caucasian male with hypertension, mixed hyperlipidemia, type 2 diabetes mellitus, asthma, atrial fibrillation, h/o stroke  Patient was hospitalized at [facility] in 02/2020 with acute CVA.  On 02/19/2020 at 1800, patient was discovered to have these stroke-like symptoms while sitting in car.The patient had come to the hospital to see his mom, who was admitted, but she had already been discharged to SNF. He was upset, and when he came back to car he noted that he was having left arm numbness while texting.. Then he felt dizzy (said it was "lightheadedness" but he was also having gait issues) and he started seeing double in the horizontal direction. He came to ED and and reported nausea and diaphoresis. The patient reports similar symptoms many years ago when he had similar dizziness and recalls having vomiting but does not recall if he had diplopia.     Patient was not a candidate for IV tPA due to the following contraindications, warnings and/or other factors: NIHSS 0, suspected recrudescence, quickly resolving symptoms   The patient was subsequently admitted for observation in the CDU. MRI brain WO contrast was obtained that demonstrated multifocal infarcts and the patient was subsequently admitted to the Neurology Stroke service for further evaluation and management.   Given aforementioned workup the patient's presenting symptoms were felt to be most likely secondary to right PCA and left MCA CVA which was likely due to cardioembolism in the setting of history of diagnosis of atrial fibrillation and not on anticoagulation. The patient was started on Xarelto 20 mg daily and Lipitor 80 mg daily for future stroke  prevention which should be continued upon discharge. While in house the patient worked with PT/OT who recommended home with PRN assistance and outpatient occupational therapy. On 02/20/20, the patient was deemed medically stable for discharge from the hospital to Home with close PCP and Neurology follow up.  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 He was discharged on Xrelto 20 mg daily and Lipitor 80 mg daily. Etiology was felt to be cardioembolic. He was discharged Xarelto 20 mg and Atorvastatin 80 mg.  Patient is doing well, denies any chest pain, shortness of breath.  He is staying active.  Blood pressure is very well controlled.   Current Outpatient Medications on File Prior to Visit  Medication Sig Dispense Refill   albuterol (VENTOLIN HFA) 108 (90 Base) MCG/ACT inhaler Inhale 2 puffs into the lungs 4 (four) times daily as needed for wheezing. 3 Inhaler 3   atorvastatin (LIPITOR) 80 MG tablet Take 80 mg by mouth daily.     lisinopril (ZESTRIL) 10 MG tablet Take 1 tablet by mouth daily.     rivaroxaban (XARELTO) 20 MG TABS tablet Take 20 mg by mouth daily with supper.     No current facility-administered medications on file prior to visit.    Cardiovascular and other pertinent studies:  Exercise Sestamibi Stress Test 05/01/2020: Normal ECG stress. The patient exercised for 7 minutes and 59 seconds of a Bruce protocol, achieving approximately 10.14 METs. No chest pain. Normal BP response. Myocardial perfusion is normal. Overall LV systolic function is normal without  regional wall motion abnormalities. Stress LV EF: 68%.  No previous exam available for comparison. Low risk.  EKG 04/25/2020: Probable sinus rhythm 56 bpm with rate variation Nonspecific T-abnormality  Low voltage   Echocardiogram 02/20/2020: LVEF 60-65%. Impaired relaxation. No obvious large intracardiac shunts seen.  There is no significant valvular stenosis or  regurgitation.  There is normal left ventricular wall thickness.    MRI brain 02/20/2020: 1.  Acute/subacute infarcts of the right cerebellum with associated local edema and no acute intraparenchymal hemorrhage or mass effect. Additional punctate infarcts including the left parietal lobe and dorsal right pons/superior cerebellar peduncle with associated T2/FLAIR hyperintensity and no acute hemorrhage. Given the multifocality consider embolic source.  2.  Sequela of chronic small vessel disease with remote lacunar infarcts of the bilateral cerebellum and right periventricular white      Recent labs: 04/17/2020: Glucose 80, BUN/Cr 19/0.95. EGFR 94. Na/K 138/4.3. Rest of the CMP normal HbA1C 6.1% Chol 123, TG 98, HDL 38, LDL 66 TSH N/A    Review of Systems  Cardiovascular:  Negative for chest pain, dyspnea on exertion, leg swelling, palpitations and syncope.        Vitals:   10/31/20 1437  BP: 139/82  Pulse: 68  Resp: 16  Temp: 98.4 F (36.9 C)  SpO2: 95%     Body mass index is 32.61 kg/m. Filed Weights   10/31/20 1437  Weight: 190 lb (86.2 kg)     Objective:   Physical Exam Vitals and nursing note reviewed.  Constitutional:      General: He is not in acute distress. Neck:     Vascular: No JVD.  Cardiovascular:     Rate and Rhythm: Normal rate and regular rhythm.     Pulses: Normal pulses.     Heart sounds: Normal heart sounds. No murmur heard. Pulmonary:     Effort: Pulmonary effort is normal.     Breath sounds: Normal breath sounds. No wheezing or rales.  Musculoskeletal:     Right lower leg: No edema.     Left lower leg: No edema.           Assessment & Recommendations:   55 year old Caucasian male with hypertension, mixed hyperlipidemia, type 2 diabetes mellitus, asthma, paroxysmal atrial fibrillation, h/o stroke  Paroxysmal atrial fibrillation: No EKG showing A. fib available to me.  However, this was felt to be most likely etiology of his  stroke in 02/2020 with which he was admitted at [facility].  He reportedly had atrial fibrillation history prior to that.  Therefore, he was recommended anticoagulation with Xarelto 20 mg daily, given CHA2DS2VASC score of 3, annual stroke risk 3.6%.  I will continue this.  I have refilled Xarelto 20 mg daily. Continue aggressive risk factor modification, including glycemic and lipid control. A1c 6.1%, LDL 66  F/u in 1 year    Nigel Mormon, MD Pager: [phone] Office: [phone]

## 2021-05-14 DIAGNOSIS — E118 Type 2 diabetes mellitus with unspecified complications: Secondary | ICD-10-CM | POA: Diagnosis not present

## 2021-05-14 DIAGNOSIS — I4891 Unspecified atrial fibrillation: Secondary | ICD-10-CM | POA: Diagnosis not present

## 2021-05-14 DIAGNOSIS — Z7901 Long term (current) use of anticoagulants: Secondary | ICD-10-CM | POA: Diagnosis not present

## 2021-05-14 DIAGNOSIS — Z1159 Encounter for screening for other viral diseases: Secondary | ICD-10-CM | POA: Diagnosis not present

## 2021-05-14 DIAGNOSIS — Z125 Encounter for screening for malignant neoplasm of prostate: Secondary | ICD-10-CM | POA: Diagnosis not present

## 2021-05-14 DIAGNOSIS — E782 Mixed hyperlipidemia: Secondary | ICD-10-CM | POA: Diagnosis not present

## 2021-05-14 DIAGNOSIS — Z0000 Encounter for general adult medical examination without abnormal findings: Secondary | ICD-10-CM | POA: Diagnosis not present

## 2021-10-31 ENCOUNTER — Ambulatory Visit: Payer: BC Managed Care – PPO | Admitting: Cardiology

## 2022-05-25 DIAGNOSIS — Z0000 Encounter for general adult medical examination without abnormal findings: Secondary | ICD-10-CM | POA: Diagnosis not present

## 2022-05-25 DIAGNOSIS — E118 Type 2 diabetes mellitus with unspecified complications: Secondary | ICD-10-CM | POA: Diagnosis not present

## 2022-05-25 DIAGNOSIS — Z125 Encounter for screening for malignant neoplasm of prostate: Secondary | ICD-10-CM | POA: Diagnosis not present

## 2022-05-25 DIAGNOSIS — E782 Mixed hyperlipidemia: Secondary | ICD-10-CM | POA: Diagnosis not present

## 2022-06-26 DIAGNOSIS — N390 Urinary tract infection, site not specified: Secondary | ICD-10-CM | POA: Diagnosis not present

## 2022-06-26 DIAGNOSIS — E118 Type 2 diabetes mellitus with unspecified complications: Secondary | ICD-10-CM | POA: Diagnosis not present

## 2022-08-28 ENCOUNTER — Ambulatory Visit
Admission: EM | Admit: 2022-08-28 | Discharge: 2022-08-28 | Disposition: A | Discharge status: Home / Self Care | Payer: BC Managed Care – PPO | Attending: Physician Assistant | Admitting: Physician Assistant

## 2022-08-28 DIAGNOSIS — Z20822 Contact with and (suspected) exposure to covid-19: Secondary | ICD-10-CM | POA: Insufficient documentation

## 2022-08-28 LAB — POCT INFLUENZA A/B
Influenza A, POC: NEGATIVE
Influenza B, POC: NEGATIVE

## 2022-08-28 NOTE — ED Triage Notes (Signed)
"  Last few days I have had a lot of congestion". "I am wondering if I have sinus infection, covid or virus". No fever.

## 2022-08-28 NOTE — ED Provider Notes (Signed)
[facility]    CSN: [id number] Arrival date & time: 08/28/22  0825      History   Chief Complaint Chief Complaint  Patient presents with   Nasal Congestion    Requests COVID19 Testing     HPI Marlan Ruschak is a 57 y.o. male.   Patient here today for evaluation of nasal congestion and drainage.  He reports that symptoms have been going on for 2 days.  He has not had any fever.  He has had some cough.  He denies any vomiting or diarrhea.  He has taken over-the-counter medication with mild relief.  The history is provided by the patient.    Past Medical History:  Diagnosis Date   Allergic rhinitis    Asthma    Hyperlipidemia    LDL 182.8   Hypertension     Patient Active Problem List   Diagnosis Date Noted   PAF (paroxysmal atrial fibrillation) (HCC) 10/31/2020   Fever 02/12/2014   Acute pharyngitis 02/12/2014   Influenza 02/12/2014   Otitis media 02/12/2014   Nonspecific elevation of levels of transaminase or lactic acid dehydrogenase (LDH) 10/05/2012   Nonspecific abnormal electrocardiogram (ECG) (EKG) 10/05/2012   Mixed hyperlipidemia 03/09/2010   Seasonal and perennial allergic rhinitis 03/25/2009   Allergic-infective asthma 03/25/2009   DIPLOPIA, HX OF 03/25/2009    Past Surgical History:  Procedure Laterality Date   WISDOM TOOTH EXTRACTION         Home Medications    Prior to Admission medications   Medication Sig Start Date End Date Taking? Authorizing Provider  albuterol (VENTOLIN HFA) 108 (90 Base) MCG/ACT inhaler Inhale 2 puffs into the lungs 4 (four) times daily as needed for wheezing. 04/20/18  Yes Hawks, Christy A, FNP  atorvastatin (LIPITOR) 80 MG tablet Take 80 mg by mouth daily.   Yes [provider]  lisinopril (ZESTRIL) 10 MG tablet Take 1 tablet by mouth daily. 04/17/20  Yes [provider]  rivaroxaban (XARELTO) 20 MG TABS tablet Take 1 tablet (20 mg total) by mouth daily with supper. 10/31/20  Yes  Patwardhan, Anabel Bene, MD    Family History Family History  Problem Relation Age of Onset   Lung cancer Maternal Grandmother        smoker   Stroke Daughter        in teens (BCP & smoking)   Heart attack Maternal Grandfather        > 54   Healthy Mother    Heart disease Father    Diabetes Neg Hx     Social History Social History   Tobacco Use   Smoking status: Never   Smokeless tobacco: Never  Vaping Use   Vaping status: Never Used  Substance Use Topics   Alcohol use: Yes    Comment: occ   Drug use: Never     Allergies   Patient has no known allergies.   Review of Systems Review of Systems  Constitutional:  Positive for chills. Negative for fever.  HENT:  Positive for congestion. Negative for ear pain and sore throat.   Eyes:  Negative for discharge and redness.  Respiratory:  Positive for cough. Negative for shortness of breath.   Gastrointestinal:  Negative for diarrhea, nausea and vomiting.     Physical Exam Triage Vital Signs ED Triage Vitals  Encounter Vitals Group     BP 08/28/22 0902 110/73     Systolic BP Percentile --      Diastolic BP Percentile --  Pulse Rate 08/28/22 0902 78     Resp 08/28/22 0902 18     Temp 08/28/22 0902 99.2 F (37.3 C)     Temp Source 08/28/22 0902 Oral     SpO2 08/28/22 0902 99 %     Weight 08/28/22 0900 205 lb (93 kg)     Height 08/28/22 0900 5\' 3"  (1.6 m)     Head Circumference --      Peak Flow --      Pain Score 08/28/22 0900 0     Pain Loc --      Pain Education --      Exclude from Growth Chart --    No data found.  Updated Vital Signs BP 110/73 (BP Location: Left Arm)   Pulse 78   Temp 99.2 F (37.3 C) (Oral)   Resp 18   Ht 5\' 3"  (1.6 m)   Wt 205 lb (93 kg)   SpO2 99%   BMI 36.31 kg/m      Physical Exam Vitals and nursing note reviewed.  Constitutional:      General: He is not in acute distress.    Appearance: Normal appearance. He is not ill-appearing.  HENT:     Head: Normocephalic  and atraumatic.     Right Ear: Tympanic membrane normal.     Left Ear: Tympanic membrane normal.     Nose: Congestion present.     Mouth/Throat:     Mouth: Mucous membranes are moist.     Pharynx: Oropharynx is clear. No oropharyngeal exudate or posterior oropharyngeal erythema.  Eyes:     Conjunctiva/sclera: Conjunctivae normal.  Cardiovascular:     Rate and Rhythm: Normal rate and regular rhythm.     Heart sounds: Normal heart sounds. No murmur heard. Pulmonary:     Effort: Pulmonary effort is normal. No respiratory distress.     Breath sounds: Normal breath sounds. No wheezing, rhonchi or rales.  Skin:    General: Skin is warm and dry.  Neurological:     Mental Status: He is alert.  Psychiatric:        Mood and Affect: Mood normal.        Thought Content: Thought content normal.      UC Treatments / Results  Labs (all labs ordered are listed, but only abnormal results are displayed) Labs Reviewed  SARS CORONAVIRUS 2 (TAT 6-24 HRS)  POCT INFLUENZA A/B    EKG   Radiology No results found.  Procedures Procedures (including critical care time)  Medications Ordered in UC Medications - No data to display  Initial Impression / Assessment and Plan / UC Course  I have reviewed the triage vital signs and the nursing notes.  Pertinent labs & imaging results that were available during my care of the patient were reviewed by me and considered in my medical decision making (see chart for details).    Suspect viral etiology of symptoms.  COVID screening ordered.  Will await results further recommendation but encouraged symptomatic treatment, increase fluids and rest.  Patient expresses understanding.  Final Clinical Impressions(s) / UC Diagnoses   Final diagnoses:  Exposure to COVID-19 virus   Discharge Instructions   None    ED Prescriptions   None    PDMP not reviewed this encounter.   Tomi Bamberger, PA-C 08/28/22 1040

## 2022-08-29 LAB — SARS CORONAVIRUS 2 (TAT 6-24 HRS): SARS Coronavirus 2: POSITIVE — AB

## 2023-01-29 ENCOUNTER — Ambulatory Visit (INDEPENDENT_AMBULATORY_CARE_PROVIDER_SITE_OTHER): Payer: BC Managed Care – PPO

## 2023-01-29 ENCOUNTER — Ambulatory Visit
Admission: EM | Admit: 2023-01-29 | Discharge: 2023-01-29 | Disposition: A | Discharge status: Home / Self Care | Payer: BC Managed Care – PPO

## 2023-01-29 DIAGNOSIS — R051 Acute cough: Secondary | ICD-10-CM

## 2023-01-29 DIAGNOSIS — Z7409 Other reduced mobility: Secondary | ICD-10-CM | POA: Insufficient documentation

## 2023-01-29 DIAGNOSIS — J09X2 Influenza due to identified novel influenza A virus with other respiratory manifestations: Secondary | ICD-10-CM

## 2023-01-29 DIAGNOSIS — R509 Fever, unspecified: Secondary | ICD-10-CM

## 2023-01-29 DIAGNOSIS — R059 Cough, unspecified: Secondary | ICD-10-CM | POA: Diagnosis not present

## 2023-01-29 LAB — POC COVID19/FLU A&B COMBO
Covid Antigen, POC: NEGATIVE
Influenza A Antigen, POC: POSITIVE — AB
Influenza B Antigen, POC: NEGATIVE

## 2023-01-29 MED ORDER — ACETAMINOPHEN 325 MG PO TABS
650.0000 mg | ORAL_TABLET | Freq: Once | ORAL | Status: AC
  Administered 2023-01-29: 650 mg via ORAL

## 2023-01-29 MED ORDER — BENZONATATE 200 MG PO CAPS: 200.0000 mg | ORAL_CAPSULE | Freq: Three times a day (TID) | ORAL | 0 refills | Status: AC | PRN

## 2023-01-29 MED ORDER — OSELTAMIVIR PHOSPHATE 75 MG PO CAPS: 75.0000 mg | ORAL_CAPSULE | Freq: Two times a day (BID) | ORAL | 0 refills | Status: AC

## 2023-01-29 NOTE — ED Triage Notes (Signed)
"  This started new years but this hasn't ran it's course, I took a home [phone] test when the symptoms started and it was Negative". "I feel like it is the Flu and would like testing". No fever "recently". "A lot of cough, congestion".

## 2023-01-29 NOTE — ED Provider Notes (Signed)
[facility]    CSN: [id number] Arrival date & time: 01/29/23  0810      History   Chief Complaint Chief Complaint  Patient presents with   Flu like Symptoms   Asthma Exacerbation    HPI Corey Soto is a 58 y.o. male who presents with onset of fever , HA, cough, body aches since last night and this am has been chilling and had one loose stool. Had been sick with URI symptoms x 16 days. He had fever, HA, body aches when he started and does not recall how long this lasted. Denies SOB or chest pains. He would like to be tested for Covid and Flu.     Past Medical History:  Diagnosis Date   Allergic rhinitis    Asthma    Hyperlipidemia    LDL 182.8   Hypertension     Patient Active Problem List   Diagnosis Date Noted   Other reduced mobility 01/29/2023   PAF (paroxysmal atrial fibrillation) (HCC) 10/31/2020   Controlled type 2 diabetes mellitus with complication, without long-term current use of insulin (HCC) 03/12/2020   Acute CVA (cerebrovascular accident) (HCC) 02/20/2020   Bilateral sensorineural hearing loss 06/14/2018   Fever 02/12/2014   Acute pharyngitis 02/12/2014   Influenza 02/12/2014   Otitis media 02/12/2014   Nonspecific elevation of levels of transaminase or lactic acid dehydrogenase (LDH) 10/05/2012   Nonspecific abnormal electrocardiogram (ECG) (EKG) 10/05/2012   Mixed hyperlipidemia 03/09/2010   Seasonal and perennial allergic rhinitis 03/25/2009   Allergic-infective asthma 03/25/2009   DIPLOPIA, HX OF 03/25/2009    Past Surgical History:  Procedure Laterality Date   WISDOM TOOTH EXTRACTION         Home Medications    Prior to Admission medications   Medication Sig Start Date End Date Taking? Authorizing Provider  albuterol (VENTOLIN HFA) 108 (90 Base) MCG/ACT inhaler Inhale 2 puffs into the lungs every 4 (four) hours as needed for wheezing or shortness of breath. 04/21/18  Yes [provider]  atorvastatin (LIPITOR)  80 MG tablet Take 80 mg by mouth daily.   Yes [provider]  benzonatate (TESSALON) 200 MG capsule Take 1 capsule (200 mg total) by mouth 3 (three) times daily as needed for cough. 01/29/23  Yes Rodriguez-Southworth, Nettie Elm, PA-C  lisinopril (ZESTRIL) 10 MG tablet Take 1 tablet by mouth daily. 04/17/20  Yes [provider]  oseltamivir (TAMIFLU) 75 MG capsule Take 1 capsule (75 mg total) by mouth every 12 (twelve) hours. 01/29/23  Yes Rodriguez-Southworth, Nettie Elm, PA-C  rivaroxaban (XARELTO) 20 MG TABS tablet Take 1 tablet (20 mg total) by mouth daily with supper. 10/31/20  Yes Patwardhan, Manish J, MD  albuterol (VENTOLIN HFA) 108 (90 Base) MCG/ACT inhaler Inhale 2 puffs into the lungs every 4 (four) hours as needed for wheezing or shortness of breath. 05/25/22   [provider]    Family History Family History  Problem Relation Age of Onset   Healthy Mother    Heart disease Father    Lung cancer Maternal Grandmother        smoker   Heart attack Maternal Grandfather        > 74   Stroke Daughter        in teens (BCP & smoking)   Diabetes Neg Hx     Social History Social History   Tobacco Use   Smoking status: Never    Passive exposure: Never   Smokeless tobacco: Never  Vaping Use  Vaping status: Never Used  Substance Use Topics   Alcohol use: Yes    Comment: occ   Drug use: Never     Allergies   Patient has no known allergies.   Review of Systems Review of Systems  As noted in HPI Physical Exam Triage Vital Signs ED Triage Vitals  Encounter Vitals Group     BP 01/29/23 0827 (!) 129/93     Systolic BP Percentile --      Diastolic BP Percentile --      Pulse Rate 01/29/23 0827 (!) 118     Resp 01/29/23 0827 18     Temp 01/29/23 0827 (!) 103.1 F (39.5 C)     Temp Source 01/29/23 0827 Oral     SpO2 01/29/23 0827 97 %     Weight 01/29/23 0825 205 lb 0.4 oz (93 kg)     Height 01/29/23 0825 5\' 3"  (1.6 m)     Head Circumference --       Peak Flow --      Pain Score 01/29/23 0817 0     Pain Loc --      Pain Education --      Exclude from Growth Chart --    No data found.  Updated Vital Signs BP 130/88 (BP Location: Right Arm)   Pulse (!) 116   Temp (!) 100.6 F (38.1 C) (Oral)   Resp 20   Ht 5\' 3"  (1.6 m)   Wt 205 lb 0.4 oz (93 kg)   SpO2 97%   BMI 36.32 kg/m   Visual Acuity Right Eye Distance:   Left Eye Distance:   Bilateral Distance:    Right Eye Near:   Left Eye Near:    Bilateral Near:     Physical Exam Physical Exam Vitals signs and nursing note reviewed.  Constitutional:  He looks flushed    General: he is not in acute distress.    Appearance: Normal appearance. He is  ill-appearing, but not toxic-appearing or diaphoretic.  HENT:     Head: Normocephalic.     Right Ear: Tympanic membrane, ear canal and external ear normal.     Left Ear: Tympanic membrane, ear canal and external ear normal.     Nose: Nose normal.     Mouth/Throat: clear    Mouth: Mucous membranes are moist.  Eyes:     General: No scleral icterus.       Right eye: No discharge.        Left eye: No discharge.     Conjunctiva/sclera: Conjunctivae normal.  Neck:     Musculoskeletal: Neck supple. No neck rigidity.  Cardiovascular:     Rate and Rhythm: Normal rate and regular rhythm.     Heart sounds: No murmur.  Pulmonary:     Effort: Pulmonary effort is normal.     Breath sounds: Normal breath sounds.  Musculoskeletal: Normal range of motion.  Lymphadenopathy:     Cervical: No cervical adenopathy.  Skin:    General: Skin is warm and dry.     Coloration: Skin is not jaundiced.     Findings: No rash.  Neurological:     Mental Status: He is alert and oriented to person, place, and time.     Gait: Gait normal.  Psychiatric:        Mood and Affect: Mood normal.        Behavior: Behavior normal.        Thought Content: Thought content normal.  Judgment: Judgment normal.    UC Treatments / Results  Labs (all  labs ordered are listed, but only abnormal results are displayed) Labs Reviewed  POC COVID19/FLU A&B COMBO - Abnormal; Notable for the following components:      Result Value   Influenza A Antigen, POC Positive (*)    All other components within normal limits   Flu B and Covid was negative EKG   Radiology DG Chest 2 View Result Date: 01/29/2023 CLINICAL DATA:  Cough and fever. EXAM: CHEST - 2 VIEW COMPARISON:  None Available. FINDINGS: Cardiac silhouette is normal in size and configuration. Normal mediastinal and hilar contours. Mild prominence of the interstitial markings in the lung bases. Lungs otherwise clear. No pleural effusion or pneumothorax. Skeletal structures are intact. IMPRESSION: No active cardiopulmonary disease. Electronically Signed   By: Amie Portland M.D.   On: 01/29/2023 09:11    Procedures Procedures (including critical care time)  Medications Ordered in UC Medications  acetaminophen (TYLENOL) tablet 650 mg (650 mg Oral Given 01/29/23 3557)    Initial Impression / Assessment and Plan / UC Course  I have reviewed the triage vital signs and the nursing notes.  Pertinent labs & imaging results that were available during my care of the patient were reviewed by me and considered in my medical decision making (see chart for details).  Influenza A  I placed him on Tamifly and Tessalon as noted    Final Clinical Impressions(s) / UC Diagnoses   Final diagnoses:  Acute cough  Fever, unspecified  Influenza due to identified novel influenza A virus with other respiratory manifestations   Discharge Instructions   None    ED Prescriptions     Medication Sig Dispense Auth. Provider   oseltamivir (TAMIFLU) 75 MG capsule Take 1 capsule (75 mg total) by mouth every 12 (twelve) hours. 10 capsule Rodriguez-Southworth, Nettie Elm, PA-C   benzonatate (TESSALON) 200 MG capsule Take 1 capsule (200 mg total) by mouth 3 (three) times daily as needed for cough. 30 capsule  Rodriguez-Southworth, Nettie Elm, PA-C      PDMP not reviewed this encounter.   Garey Ham, [place] 01/29/23 3220

## 2023-05-18 DIAGNOSIS — Z1211 Encounter for screening for malignant neoplasm of colon: Secondary | ICD-10-CM | POA: Diagnosis not present

## 2023-06-22 DIAGNOSIS — E118 Type 2 diabetes mellitus with unspecified complications: Secondary | ICD-10-CM | POA: Diagnosis not present

## 2023-06-22 DIAGNOSIS — Z0000 Encounter for general adult medical examination without abnormal findings: Secondary | ICD-10-CM | POA: Diagnosis not present

## 2023-06-22 DIAGNOSIS — I10 Essential (primary) hypertension: Secondary | ICD-10-CM | POA: Diagnosis not present

## 2023-06-22 DIAGNOSIS — Z125 Encounter for screening for malignant neoplasm of prostate: Secondary | ICD-10-CM | POA: Diagnosis not present

## 2023-06-22 DIAGNOSIS — E782 Mixed hyperlipidemia: Secondary | ICD-10-CM | POA: Diagnosis not present

## 2023-06-22 DIAGNOSIS — I480 Paroxysmal atrial fibrillation: Secondary | ICD-10-CM | POA: Diagnosis not present

## 2023-09-15 ENCOUNTER — Other Ambulatory Visit (HOSPITAL_BASED_OUTPATIENT_CLINIC_OR_DEPARTMENT_OTHER): Payer: Self-pay

## 2023-09-15 MED ORDER — FLUZONE 0.5 ML IM SUSY
0.5000 mL | PREFILLED_SYRINGE | Freq: Once | INTRAMUSCULAR | 0 refills | Status: AC
  Filled 2023-09-15: qty 0.5, 1d supply, fill #0

## 2023-09-23 DIAGNOSIS — E782 Mixed hyperlipidemia: Secondary | ICD-10-CM | POA: Diagnosis not present

## 2023-09-23 DIAGNOSIS — E118 Type 2 diabetes mellitus with unspecified complications: Secondary | ICD-10-CM | POA: Diagnosis not present
